# Patient Record
Sex: Female | Born: 1945 | Race: White | Hispanic: No | Marital: Married | Smoking: Never smoker
Health system: Southern US, Community
[De-identification: ages and names within clinical notes are randomized; demographics above are authoritative.]

## PROBLEM LIST (undated history)

## (undated) DIAGNOSIS — I517 Cardiomegaly: Secondary | ICD-10-CM

## (undated) DIAGNOSIS — I639 Cerebral infarction, unspecified: Secondary | ICD-10-CM

## (undated) DIAGNOSIS — I119 Hypertensive heart disease without heart failure: Secondary | ICD-10-CM

## (undated) DIAGNOSIS — I499 Cardiac arrhythmia, unspecified: Secondary | ICD-10-CM

## (undated) DIAGNOSIS — E669 Obesity, unspecified: Secondary | ICD-10-CM

## (undated) DIAGNOSIS — I2510 Atherosclerotic heart disease of native coronary artery without angina pectoris: Secondary | ICD-10-CM

## (undated) DIAGNOSIS — I4819 Other persistent atrial fibrillation: Secondary | ICD-10-CM

## (undated) HISTORY — PX: ABDOMINAL HYSTERECTOMY: SHX81

## (undated) HISTORY — PX: BIOPSY THYROID: PRO38

## (undated) HISTORY — DX: Cerebral infarction, unspecified: I63.9

## (undated) HISTORY — PX: ABDOMINAL HYSTERECTOMY: SUR658

## (undated) HISTORY — DX: Other persistent atrial fibrillation: I48.19

## (undated) HISTORY — PX: THYROIDECTOMY, PARTIAL: SHX18

---

## 2005-01-26 ENCOUNTER — Ambulatory Visit: Payer: Self-pay | Admitting: Internal Medicine

## 2005-02-14 ENCOUNTER — Ambulatory Visit: Payer: Self-pay | Admitting: Cardiology

## 2005-03-07 ENCOUNTER — Ambulatory Visit: Payer: Self-pay | Admitting: Cardiology

## 2005-10-09 ENCOUNTER — Ambulatory Visit: Payer: Self-pay | Admitting: Cardiology

## 2005-10-26 ENCOUNTER — Ambulatory Visit: Payer: Self-pay | Admitting: Cardiology

## 2006-05-29 ENCOUNTER — Ambulatory Visit: Payer: Self-pay | Admitting: Cardiology

## 2006-05-31 ENCOUNTER — Ambulatory Visit: Payer: Self-pay | Admitting: Cardiology

## 2006-08-06 DIAGNOSIS — I2510 Atherosclerotic heart disease of native coronary artery without angina pectoris: Secondary | ICD-10-CM

## 2006-08-06 HISTORY — DX: Atherosclerotic heart disease of native coronary artery without angina pectoris: I25.10

## 2006-08-29 ENCOUNTER — Encounter: Admission: RE | Admit: 2006-08-29 | Discharge: 2006-08-29 | Payer: Self-pay | Admitting: Obstetrics and Gynecology

## 2006-12-03 ENCOUNTER — Ambulatory Visit: Payer: Self-pay | Admitting: Nurse Practitioner

## 2006-12-03 ENCOUNTER — Encounter: Payer: Self-pay | Admitting: Cardiology

## 2006-12-05 ENCOUNTER — Inpatient Hospital Stay (HOSPITAL_BASED_OUTPATIENT_CLINIC_OR_DEPARTMENT_OTHER): Admission: RE | Admit: 2006-12-05 | Discharge: 2006-12-05 | Payer: Self-pay | Admitting: Cardiology

## 2006-12-05 ENCOUNTER — Ambulatory Visit: Payer: Self-pay | Admitting: Cardiology

## 2006-12-10 ENCOUNTER — Ambulatory Visit: Payer: Self-pay | Admitting: Cardiology

## 2006-12-11 ENCOUNTER — Ambulatory Visit: Payer: Self-pay | Admitting: Cardiology

## 2006-12-17 ENCOUNTER — Ambulatory Visit: Payer: Self-pay | Admitting: Cardiology

## 2007-08-14 ENCOUNTER — Encounter: Admission: RE | Admit: 2007-08-14 | Discharge: 2007-08-14 | Payer: Self-pay | Admitting: Obstetrics and Gynecology

## 2008-05-25 ENCOUNTER — Ambulatory Visit: Payer: Self-pay | Admitting: Cardiology

## 2008-06-02 ENCOUNTER — Ambulatory Visit: Payer: Self-pay | Admitting: Cardiology

## 2008-08-23 ENCOUNTER — Encounter: Admission: RE | Admit: 2008-08-23 | Discharge: 2008-08-23 | Payer: Self-pay | Admitting: Family Medicine

## 2009-02-18 ENCOUNTER — Ambulatory Visit: Payer: Self-pay | Admitting: Cardiology

## 2009-02-22 ENCOUNTER — Ambulatory Visit: Payer: Self-pay

## 2009-02-25 ENCOUNTER — Encounter: Payer: Self-pay | Admitting: Physician Assistant

## 2009-03-25 ENCOUNTER — Encounter: Payer: Self-pay | Admitting: Physician Assistant

## 2009-04-27 DIAGNOSIS — I080 Rheumatic disorders of both mitral and aortic valves: Secondary | ICD-10-CM

## 2009-04-27 DIAGNOSIS — I429 Cardiomyopathy, unspecified: Secondary | ICD-10-CM | POA: Insufficient documentation

## 2009-04-27 DIAGNOSIS — I2510 Atherosclerotic heart disease of native coronary artery without angina pectoris: Secondary | ICD-10-CM

## 2009-08-24 ENCOUNTER — Encounter: Admission: RE | Admit: 2009-08-24 | Discharge: 2009-08-24 | Payer: Self-pay | Admitting: Family Medicine

## 2009-12-01 ENCOUNTER — Encounter: Payer: Self-pay | Admitting: Cardiology

## 2010-03-07 ENCOUNTER — Ambulatory Visit: Payer: Self-pay | Admitting: Cardiology

## 2010-03-07 ENCOUNTER — Encounter: Payer: Self-pay | Admitting: Physician Assistant

## 2010-03-07 DIAGNOSIS — I10 Essential (primary) hypertension: Secondary | ICD-10-CM

## 2010-03-15 ENCOUNTER — Ambulatory Visit: Payer: Self-pay | Admitting: Cardiology

## 2010-03-15 ENCOUNTER — Encounter: Payer: Self-pay | Admitting: Physician Assistant

## 2010-03-17 ENCOUNTER — Encounter (INDEPENDENT_AMBULATORY_CARE_PROVIDER_SITE_OTHER): Payer: Self-pay | Admitting: *Deleted

## 2010-08-26 ENCOUNTER — Other Ambulatory Visit: Payer: Self-pay | Admitting: Obstetrics and Gynecology

## 2010-08-26 DIAGNOSIS — Z1239 Encounter for other screening for malignant neoplasm of breast: Secondary | ICD-10-CM

## 2010-09-05 NOTE — Assessment & Plan Note (Signed)
Summary: 1 YR FU PER JULY REMINDER-SRS   Visit Type:  Follow-up Primary [name]:  [name]   History of Present Illness: patient presents for annual followup.  Since her last visit here back in July 2010, she reports no significant change from her baseline level of exercise tolerance. She has since been started on lisinopril, per Dr. Margarita Mail recommendation, for blood pressure management, and she has noted a significant drop in her systolic BP, when occasionally checking it at local pharmacies.  Preventive Screening-Counseling & Management  Alcohol-Tobacco     Smoking Status: never  Current Medications (verified): 1)  Lasix 20 Mg Tabs (Furosemide) .... Take 1 Tablet By Mouth Once A Day 2)  Lisinopril-Hydrochlorothiazide 20-12.5 Mg Tabs (Lisinopril-Hydrochlorothiazide) .... Take 1 Tablet By Mouth Once A Day 3)  Toprol Xl 50 Mg Xr24h-Tab (Metoprolol Succinate) .... Take 1/2 Tablet By Mouth Two Times A Day 4)  Aspir-Trin 325 Mg Tbec (Aspirin) .... Take 1 Tablet By Mouth Once A Day 5)  Caltrate 600+d 600-400 Mg-Unit Tabs (Calcium Carbonate-Vitamin D) .... Take 1 Tablet By Mouth Two Times A Day 6)  Vitamin D 1000 Unit Tabs (Cholecalciferol) .... Take 1 Tablet By Mouth Once A Day 7)  Simvastatin 40 Mg Tabs (Simvastatin) .... Take 1 Tablet By Mouth Once A Day 8)  Multivitamins  Tabs (Multiple Vitamin) .... Take 1 Tablet By Mouth Once A Day 9)  Coenzyme Q10 50 Mg Caps (Coenzyme Q10) .... Take 1 Tablet By Mouth Once A Day 10)  Glucosamine 500 Mg Caps (Glucosamine Sulfate) .... Take 1 Tablet By Mouth Twice A Day  Allergies (verified): 1)  ! Codeine  Comments:  Nurse/Medical Assistant: The patient's medication list and allergies were reviewed with the patient and were updated in the Medication and Allergy Lists.  Past History:  Past Medical History: Last updated: 04/27/2009 MITRAL REGURGITATION, MODERATE (ICD-396.3) [name], NATIVE VESSEL (ICD-414.01) CARDIOMYOPATHY, SECONDARY  (ICD-425.9)    Review of Systems       No fevers, chills, hemoptysis, dysphagia, melena, hematocheezia, hematuria, rash, claudication, orthopnea, pnd, pedal edema. All other systems negative.   Vital Signs:  Patient profile:   65 year old female Height:      64 inches Weight:      224 pounds BMI:     38.59 Pulse rate:   65 / minute BP sitting:   135 / 82  (left arm) Cuff size:   large  Vitals Entered By: Carlye Grippe (March 07, 2010 11:07 AM)  Nutrition Counseling: Patient's BMI is greater than 25 and therefore counseled on weight management options.  Physical Exam  Additional Exam:  GEN:65 year old female, obese, sitting upright, no distress HEENT: NCAT,PERRLA,EOMI NECK: palpable pulses, no bruits; no JVD; no TM LUNGS: CTA bilaterally HEART: RRR (S1S2); soft grade 1-2/6 systolic ejection murmur at the base, with slight increase by Valsalva; no rubs; no gallops ABD: soft, NT; intact BS EXT: intact distal pulses; no edema SKIN: warm, dry MUSC: no obvious deformity NEURO: A/O (x3)     EKG  Procedure date:  03/07/2010  Findings:      normal sinus rhythm at 63 bpm; normal axis; borderline LVH by voltage criteria; chronic T wave inversion in inferolateral leads  Impression & Recommendations:  Problem # 1:  CARDIOMYOPATHY, SECONDARY (ICD-425.9)  we'll order a surveillance 2-D echocardiogram for reassessment of hypertrophic cardiomyopathy, and rule out of any significant valvular disease. Her last echo in October 2009 indicated normal LV function (EF 60-65%), normal wall motion, and moderate concentric LVH. There  were no significant valvular abnormalities. Schedule return visit with Dr. Andee Lineman in one year.  Orders: 2-D Echocardiogram (2D Echo)  Problem # 2:  ESSENTIAL HYPERTENSION, BENIGN (ICD-401.1)  improved, following the addition of lisinopril. Followup blood work was stable.  Problem # 3:  [name], NATIVE VESSEL (ICD-414.01)  continue secondary prevention,  including aggressive lipid management with target LDL of 70 or less, if feasible. Will decrease aspirin from full dose to 81 mg daily.  Other Orders: EKG w/ Interpretation (93000)  Patient Instructions: 1)  Your physician wants you to follow-up in: 1 year. You will receive a reminder letter in the mail one-two months in advance. If you don't receive a letter, please call our office to schedule the follow-up appointment. 2)  Decrease Aspirin to 81mg  by mouth once daily. 3)  Your physician has requested that you have an echocardiogram.  Echocardiography is a painless test that uses sound waves to create images of your heart. It provides your doctor with information about the size and shape of your heart and how well your heart's chambers and valves are working.  This procedure takes approximately one hour. There are no restrictions for this procedure. If the results of your test are normal or stable, you will receive a letter. If they are abnormal, the nurse will contact you by phone.

## 2010-09-05 NOTE — Letter (Signed)
Summary: External Correspondence/ OFFICE VISIT [facility]  External Correspondence/ OFFICE VISIT [facility]   Imported By: Dorise Hiss 12/07/2009 15:11:24  _____________________________________________________________________  External Attachment:    Type:   Image     Comment:   External Document

## 2010-09-05 NOTE — Letter (Signed)
Summary: Engineer, materials at [facility]  [address]   [place], [place] [address]   Phone: [phone]  Fax: [phone]        March 17, 2010 MRN: [id number]   ROZALYNN BUEGE [address] [place], [place]  [address]   Dear Ms. Corine Shelter,  Your test ordered by Selena Batten has been reviewed by your physician (or physician assistant) and was found to be normal or stable. Your physician (or physician assistant) felt no changes were needed at this time.  __X__ Echocardiogram  ____ Cardiac Stress Test  ____ Lab Work  ____ Peripheral vascular study of arms, legs or neck  ____ CT scan or X-ray  ____ Lung or Breathing test  ____ Other:   Thank you.   Hoover Brunette, LPN    Duane Boston, M.D., F.A.C.C. Thressa Sheller, M.D., F.A.C.C. Oneal Grout, M.D., F.A.C.C. Cheree Ditto, M.D., F.A.C.C. Daiva Nakayama, M.D., F.A.C.C. Kenney Houseman, M.D., F.A.C.C. Jeanne Ivan, PA-C

## 2010-09-26 ENCOUNTER — Encounter: Payer: Self-pay | Admitting: Cardiology

## 2010-09-26 ENCOUNTER — Ambulatory Visit
Admission: RE | Admit: 2010-09-26 | Discharge: 2010-09-26 | Disposition: A | Discharge status: Home / Self Care | Payer: Medicare Other | Source: Ambulatory Visit | Attending: Obstetrics and Gynecology | Admitting: Obstetrics and Gynecology

## 2010-09-26 DIAGNOSIS — Z1239 Encounter for other screening for malignant neoplasm of breast: Secondary | ICD-10-CM

## 2010-10-03 ENCOUNTER — Encounter: Payer: Self-pay | Admitting: Cardiology

## 2010-10-17 NOTE — Letter (Signed)
Summary: External Correspondence/  [facility]  External Correspondence/  [facility]   Imported By: Dorise Hiss 10/11/2010 15:16:43  _____________________________________________________________________  External Attachment:    Type:   Image     Comment:   External Document

## 2010-12-19 NOTE — Assessment & Plan Note (Signed)
[facility]                          CHRONIC HEART FAILURE NOTE   [name], [name]                    MRN:          [id number]  DATE:12/03/2006                            DOB:          24-Nov-1945    [name] presents today for a followup visit.  She is followed by Dr.  Andee Lineman and Dr. [name].  She is a very pleasant 65 year old Caucasian  female seen in the past by Dr. Andee Lineman secondary to mitral regurgitation.  Apparently, was diagnosed after a new murmur was found by her GYN  physician.  Patient underwent a TEE that showed moderate MR with  possible partial cordal tear.  Patient has been followed by Dr. Andee Lineman,  and has done well.  She also underwent a stress Myoview with Bruce  protocol in March 2007 that showed a normal EF, and no ischemia at that  time.  Most recent echocardiogram done in October 2007 showed an EF of  55% to 60% MR.  [name], today, denies any episodes of chest  discomfort.  She does complain of increased shortness of breath.  She  contributes most of her shortness of breath and fatigue to her weight.  She has never had any frank chest discomfort, heaviness, or tightness.  She is an active 65 year old female.  She watches her grand kids.  She  tries to do some light yard work.  She is limited, however, by her left  knee discomfort.  She states this prevents her from being as active as  she would like in trying to lose weight.  She is concerned, however,  about the ongoing dyspnea that seems to be worse over the last few  months.  She denies any orthopnea, PND, presyncope, or syncopal  episodes.  Her primary complaint is of her dyspnea and knee pain  secondary to degenerative joint disease.   PAST MEDICAL HISTORY:  Also includes:  1. Morbid obesity.  2. Hypertension.  3. Degenerative joint disease.  4. Moderate mitral regurgitation.  5. Moderate left ventricular hypertrophy.  6. Hyperlipidemia.   REVIEW OF  SYSTEMS:  As stated above.   CURRENT MEDICATIONS:  Include:  1. Simvastatin 40 mg daily.  2. Lisinopril/hydrochlorothiazide 20/25 mg daily.  3. CoQ10 50 mg daily.  4. Calcium and vitamin D.  5. Multivitamin.  6. Glucosamine 1000 mg daily.  7. Aspirin 325 mg daily.   ALLERGIES:  CODEINE.   PHYSICAL EXAMINATION:  Weight 228 pounds.  Blood pressure initially  154/86 manual, 142/90 with a heart rate of 91.  [name] is in no  acute distress.  NECK:  Supple without carotid bruits.  No JVD noted.  LUNGS:  Clear to auscultation bilaterally.  CARDIOVASCULAR EXAM:  Revealed an S1 and S2.  Regular rate and rhythm.  Patient has a 2/6 systolic ejection murmur.  ABDOMEN:  Soft and non-tender.  Positive bowel sounds.  LOWER EXTREMITIES:  Without clubbing, cyanosis, or edema.  NEUROLOGIC:  Patient alert and oriented x3.  Cranial nerves 2 through 12  grossly intact.  A 12-lead EKG obtained today shows change from previous EKG obtained  during  stress Myoview in March 2007.  Patient has T wave inversions in  inferior and lateral leads.  Heart rate of 91.   IMPRESSION:  1. Ongoing dyspnea, which is increased in intensity in the setting of      abnormal EKG with change present since March 2007, at which time      patient had a negative stress Myoview.  2. Hypertension.  3. Hyperlipidemia.  4. Obesity.   PLAN:  EKG changes have been discussed with patient.  She had negative  stress Myoview approximately 1 year ago.  However, with her obesity and  breast attenuation, I would recommend proceeding with cardiac  catheterization for clear etiology of her EKG changes.  [name] has  been educated on the risks and benefits of cardiac catheterization, and  agrees to proceed.  This will be arranged through the [facility] this week.  I am also going to start her on Toprol XL 25 mg daily in  addition to lisinopril and hydrochlorothiazide.  I have asked her to  continue the aspirin.  I  have also given her a prescription for  nitroglycerin p.r.n. to have on hand.  We will obtain baseline lab work,  and proceed with cardiac catheterization.  Patient knows if her symptoms  change or worsen, she is to seek immediate medical assistance prior to  her scheduled cardiac catheterization on Thursday.      Dorian Pod, ACNP       Learta Codding, MD,FACC    MB/MedQ  DD: 12/03/2006  DT: 12/03/2006  Job #: [id number]

## 2010-12-19 NOTE — Assessment & Plan Note (Signed)
[facility]                          [facility] NOTE   NAME:[name], [name]                    MRN:          [id number]  DATE:05/25/2008                            DOB:          June 16, 1946    HISTORY OF PRESENT ILLNESS:  The patient is a 65 year old female with a  history of hypertrophic cardiomyopathy.  She also has known mitral  regurgitation.  The patient went for a second opinion to [facility].  It was felt that she could be treated medically and we do  not have a formal evaluation, but metoprolol was increased.  She states  that she is doing well.  She has had no deterioration in her symptoms.  She is short of breath on exertion probably due to deconditioning and  rate.  It was felt that the mitral regurgitation was stable and did not  need surgical intervention.  Interestingly, the patient reports that  after colonoscopy post anesthesia, she had a saturation of 82%.  She  gets many features of possible obstructive sleep apnea.  Next, she  denies any chest pain, palpitations, or syncope.   MEDICATIONS:  1. Simvastatin 40 mg p.o. daily.  2. Lisinopril/hydrochlorothiazide 20/25 p.o. daily.  3. Coenzyme Q10.  4. Calcium with vitamin D.  5. Multivitamin.  6. Glucosamine 1000 mg a day.  7. Aspirin 325.  8. Metoprolol 25 mg twice a day.  9. Hydrochlorothiazide 25 mg p.o. daily.   PHYSICAL EXAMINATION:  VITAL SIGNS:  Blood pressure 157/90, heart rate  69, weight 230 pounds.  NECK:  Normal carotid upstrokes.  No carotid bruits.  LUNGS:  Clear breath sounds bilaterally.  HEART:  Regular rate and rhythm.  Normal S1 and S2 and a 2/6 crescendo-  decrescendo murmur.  ABDOMEN:  Soft, nontender.  No rebound or guarding.  Good bowel sounds.  EXTREMITIES:  No cyanosis, clubbing, or edema.   PROBLEMS:  1. Hypertrophic cardiomyopathy (nonobstructive).  2. Mitral regurgitation (moderate).  3. New York Heart Association class IIB.  4.  Nonobstructive coronary artery disease.  5. Rule out obstructive sleep apnea.   PLAN:  1. The patient is stable from a cardiac standpoint.  I reviewed her      EKG which continues to demonstrate the left ventricular hypertrophy      with secondary repolarization changes.  I also changed her      hydrochlorothiazide to Lasix as on her catheterization,  her wedge      pressure was 24 mmHg.  2. I do think that the patient has significant obstructive sleep apnea      which can be contributing to her hypertension, hypertrophic      cardiomyopathy with no realities.  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  3. We will repeat the echo to reassess mitral regurgitation.     Learta Codding, MD,FACC  Electronically Signed    GED/MedQ  DD: 05/25/2008  DT: 05/26/2008  Job #: [id number]

## 2010-12-19 NOTE — Cardiovascular Report (Signed)
NAME:  [name], [name] NO.:  [id number]   MEDICAL RECORD NO.:  [id number]          PATIENT TYPE:  OIB   LOCATION:  1965                         FACILITY:  [facility]   PHYSICIAN:  Salvadore Farber, MD  DATE OF BIRTH:  05-22-1946   DATE OF PROCEDURE:  12/05/2006  DATE OF DISCHARGE:                            CARDIAC CATHETERIZATION   CARDIAC CATHETERIZATION   PROCEDURE:  Left and right heart catheterization, left ventriculography,  coronary angiography.   INDICATIONS:  [name] is a 65 year old lady with mitral  regurgitation which has been followed by Dr. Andee Lineman.  Last year, she had  a transesophageal echocardiogram demonstrating at least moderate mitral  regurgitation with possible chordal rupture.  She presented for followup  yesterday with persistent dyspnea.  Electrocardiogram demonstrated new T-  wave inversions across the precordium.  She was referred for right and  left heart catheterization.   PROCEDURAL TECHNIQUE:  Informed consent was obtained.  Underwent 1%  lidocaine local anesthesia, a 4-French sheath was placed in the right  common femoral artery and a 7-French sheath in the right common femoral  vein using modified Seldinger technique.  Heart catheterization was  performed using a balloon-tipped catheter.  Cardiac output was measured  using the thermodilution technique.  Left heart catheterization and  ventriculography were performed using a pigtail catheter.  Coronary  angiography was performed using JL-4 and 3-D RCA catheters.  The patient  tolerated the procedure well and was transferred to the holding room in  stable condition.  Sheaths will be removed there.   COMPLICATIONS:  None.   FINDINGS:  HEMODYNAMICS:  RA 14/13/11, RV 50/14/17, PA 44/30/36, PCW 28/23/22, aorta 145/80/109,  LV 142/13/28.  Cardiac output/index (thermodilution) 4.5/2.2.  There is  no aortic stenosis or mitral stenosis.   VENTRICULOGRAPHY:  Ejection fraction is  more than 65% without regional wall motion  abnormality.  There is at least moderate mitral regurgitation.  Grading  of the mitral regurgitation is hindered by ectopy during  ventriculography.   CORONARY ANGIOGRAPHY:  Left main:  Angiographically normal.  LAD:  Moderate-sized vessel giving rise to moderate sized diagonals.  It  is angiographically normal.  Circumflex:  Moderate-sized vessel giving rise to one large branching  marginal.  It is angiographically normal.  RCA:  Moderate-sized dominant vessel.  There is a 30% stenosis in its  midsection.   IMPRESSION/PLAN:  1. Moderate pulmonary hypertension in the setting of elevated      pulmonary capillary wedge pressure.  2. At least moderate mitral regurgitation.  3. Normal left ventricular size and systolic function.  4. Minimal coronary disease.   The patient will follow up with Dr. Andee Lineman.  Her moderate pulmonary  hypertension does raise some concern in the setting of her at least  moderate mitral regurgitation.  Question has previously been raised of a  chordal rupture.  We will have her follow with Dr. Andee Lineman for  consideration of repeat transesophageal echocardiogram.  Her chronic  dyspnea makes evaluation of her symptomatology difficult.      Salvadore Farber, MD  Electronically Signed     WED/MEDQ  D:  12/05/2006  T:  12/05/2006  Job:  [id number]

## 2010-12-19 NOTE — Assessment & Plan Note (Signed)
[facility]                          [facility] NOTE   [name], [name]                    MRN:          [id number]  DATE:12/17/2006                            DOB:          08-26-1945    HISTORY OF PRESENT ILLNESS:  The patient is a 65 year old female with a  history of possible chord tear and moderate mitral regurgitation. The  patient underwent a recent catheterization. She was found the have no  outflow gradient. Pulmonary capillary wedge pressure was 24 mmHg. She  was found to have moderately severe mitral regurgitation by  ventriculography.   The patient presents today for a groin check. Her groin does demonstrate  a hematoma and a knot at the incision site; however, this knot is not  pulsatile, there is no bruit noted. The groin appears to be stable and  there are no signs of infection.   The patient was reassured about this finding. She was also told that she  would have an appointment with Dr. Regino Schultze at [facility] to review the echocardiographic studies and catheterization. The  question is whether the patient could have some degree of outflow  gradient rather than pure mitral regurgitation as a cause for her  dyspnea. A message will be sent to Dr. Regino Schultze. The patient will be called  and will take all her reports and CDs of the imaging studies to Dr.  Regino Schultze.     Learta Codding, MD,FACC     GED/MedQ  DD: 12/17/2006  DT: 12/17/2006  Job #: [id number]

## 2010-12-19 NOTE — Assessment & Plan Note (Signed)
[facility]                          [facility] NOTE   [name], [name]                    MRN:          [id number]  DATE:02/18/2009                            DOB:          [birth year]    REFERRING PHYSICIAN:  Donzetta Sprung   HISTORY OF PRESENT ILLNESS:  The patient is a 65 year old female with a  history of hypertrophic cardiomyopathy.  Although the patient has been  evaluated by [facility] and recommendation was to treat her  medically.  At that time, metoprolol was increased.  Unfortunately  particularly during the last office visit now again also the patient has  significant hypertension which is not controlled.  She assures me that  at home her blood pressure is normal, but then again she never really  takes it.  It was normal during the last office visit with Dr. [name].  However, I told the patient I am very concerned and I do think that she  has significant hypertension and more like has a hypertensive-  hypertrophic cardiomyopathy.  The patient, however, feels good.  She  denies any chest pain, shortness of breath, orthopnea, or PND.  She is  very active.  She has no specific cardiovascular complaints.   MEDICATIONS:  1. Simvastatin 40 mg p.o. daily.  2. Coenzyme Q10 50 mg p.o. daily.  3. Calcium and vitamin D 600 mg p.o. b.i.d.  4. Multivitamin.  5. Glucosamine.  6. Aspirin 325 daily.  7. Metoprolol 50 mg half a tablet p.o. b.i.d.  8. Lasix 20 mg p.o. daily.  9. Vitamin D 1000 units daily.   PHYSICAL EXAMINATION:  VITAL SIGNS:  Blood pressure 169/83, heart rate  64, and weight is 228 pounds.  GENERAL:  Overweight white female in no apparent distress.  HEENT:  Pupils are anisocoric.  Conjunctivae clear.  NECK:  Supple.  Normal carotid upstroke.  No carotid bruits.  LUNGS:  Clear breath sounds bilaterally.  HEART:  Regular rate and rhythm.  Normal S1 and S2.  No pathological  murmurs.  ABDOMEN:  Soft and  nontender.  No rebound or guarding.  Good bowel  sounds.  EXTREMITIES:  No cyanosis, clubbing, or edema.  NEUROLOGIC:  The patient is alert, oriented, and grossly nonfocal.   PROBLEM LIST:  1. Nonobstructive hypertrophic cardiomyopathy, cannot rule out      hypertensive-hypertrophic cardiomyopathy.  2. Moderate mitral regurgitation.  No significant pathological murmur      currently.  3. Nonobstructive coronary artery disease.  4. New York Heart Association class I-II.  5. Rule out obstructive sleep apnea.   PLAN:  1. From cardiac standpoint, the patient is doing well.  She has no      symptoms.  She will need an echocardiogram in 1 year.  2. I do suspect the patient has significant hypertension and she is      currently not taking lisinopril or hydrochlorothiazide anymore.  I      have asked her to come in next week with her blood pressure cuff      and to obtain readings for 10 days, twice a day.  If she is indeed      hypertensive, we will start her on lisinopril 20 mg p.o. daily and      check a BMET in 1 week.     Learta Codding, MD,FACC  Electronically Signed    GED/MedQ  DD: 02/18/2009  DT: 02/19/2009  Job #: [id number]   cc:   Donzetta Sprung

## 2010-12-19 NOTE — Assessment & Plan Note (Signed)
[facility]                          [facility]   SHERIDEN, ARCHIBEQUE                    MRN:          [id number]  DATE:12/10/2006                            DOB:          July 03, 1946    REFERRING PHYSICIAN:  Donzetta Sprung   HISTORY OF PRESENT ILLNESS:  The patient is a 65 year old female with a  history of  possible partial chordal tear and moderate mitral  regurgitation.  The patient has a normal ejection fraction previously  documented at greater than 65%.  The patient was seen by the PA in the  office on December 03, 2006, and complained of possibly some progressive  dyspnea.  She was scheduled for catheterization and was found to have  minimal coronary artery disease.  She was found to have elevated  pulmonary capillary wedge pressure of approximately 24 millimeters of  mercury with a normal cardiac index and mild to moderate pulmonary  hypertension.  Ejection fraction was greater than 65% and by  ventriculogram, there was at least moderate mitral regurgitation.   The patient states that since her last visit she was put on metoprolol  and has experienced a significant increase in the level of fatigue.  Her  shortness of breath is stable.  She is NYH class 2B.  She has no chest  pain.  She also reports no complication related to her groin stick.   MEDICATIONS:  Simvastatin 20 mg a day.  Lisinopril/Hydrochlorothiazide 20 mg/25 mg p.o. daily.  Co-Enzyme Q10 50 mg a day.  Calcium.  Vitamin D.  Multivitamin.  Glucosamine.  Aspirin.  Metoprolol ER 25 mg a day.   PHYSICAL EXAMINATION:  VITAL SIGNS:  Blood pressure 121/67.  Heart rate  96 beats per minute.  Weight 229.  GENERAL:  Overweight white female but in no apparent distress.  HEENT:  Pupils anisocoric.  Conjunctivae clear.  NECK:  Supple.  Normal carotid upstrokes.  No bruits.  LUNGS:  Clear breath sounds bilaterally.  HEART:  Regular rate and rhythm with normal S1 and  S2.  There is a 3-4/6  holosystolic murmur heard last at the apex.  ABDOMEN:  Soft.  EXTREMITY EXAM:  No cyanosis, clubbing, or edema.   PROBLEM LIST:  1. Mitral regurgitation, moderate by echo and ventriculography.  2. New York Heart Association class 2B.  3. Possible partial chordal tear.  4. Nonobstructive coronary artery disease.  5. Left ventricular hypertrophy.   PLAN:  1. The patient will be referred for repeat echocardiographic study to      reassess her mitral regurgitation.  2. The patient going forward will need an echo every six months with      close attention to ventricular volumes and ejection fraction.  3. If there are any questions regarding severity of mitral      regurgitation, particularly in light of some increasing shortness      of breath, the patient may require a repeat transesophageal      echocardiogram.  4. We have also decided to start diuretic treatment with Lasix based      on her severe mitral regurgitation  after reviewing the      echocardiogram.     Learta Codding, MD,FACC     GED/MedQ  DD: 12/10/2006  DT: 12/10/2006  Job #: [id number]   cc:   Donzetta Sprung

## 2010-12-19 NOTE — Assessment & Plan Note (Signed)
[facility]                          [facility] NOTE   [name], [name]                    MRN:          [id number]  DATE:12/03/2006                            DOB:          October 22, 1945    [name] is here today for a followup appointment status post recent  stress Myoview. She underwent a Cardiolite stress by Bruce protocol on  March   Dictation ended at this point.      Dorian Pod, ACNP       Learta Codding, MD,FACC    MB/MedQ  DD: 12/03/2006  DT: 12/03/2006  Job #: [id number]

## 2010-12-22 NOTE — Assessment & Plan Note (Signed)
[facility]                            [facility] NOTE   NAME:WATKINSClemie, General                    MRN:          [id number]  DATE:05/29/2006                            DOB:          [birth year]    HISTORY OF PRESENT ILLNESS:  The patient is a 65 year old female with a  history of known mitral regurgitation.  The patient is status post  transesophageal echocardiographic test study which showed moderate mitral  regurgitation with possible partial chordal tear.  The patient has been  doing well.  She reports no chest pain, shortness of breath, orthopnea, or  PND.  She remains very active.  She has no palpitations or syncope.  She  does report some knee pain secondary to degenerative joint disease.   MEDICATIONS:  1. Simvastatin 40 mg a day.  2. Lisinopril with hydrochlorothiazide 20/25 mg a day.  3. Coenzyme Q10 50 mg a day.  4. Calcium with vitamin D 600 mg p.o. b.i.d.  5. Multivitamin daily.  6. Glucosamine 100 mg p.o. daily.  7. Aspirin 325 daily.   PHYSICAL EXAMINATION:  VITAL SIGNS:  Blood pressure 138/78, heart rate 84  beats per minute, weight is 227 pounds.  NECK:  Normal carotid upstroke, no carotid bruits.  LUNGS:  Clear breath sounds bilaterally.  HEART:  Regular rate and rhythm with normal S1, S2, and a 2-3/6 holosystolic  murmur at the left lower sternal border and towards the apex.  ABDOMEN:  Soft.  EXTREMITIES:  No cyanosis, clubbing or edema.   PROBLEM LIST:  1. Moderate mitral regurgitation.  2. Normal left ventricular size and contraction, ejection fraction 67%.  3. NYHA class I.  4. Moderate left ventricular hypertrophy.   1. The patient will have a followup echocardiogram done tomorrow.  2. The patient had a previous Cardiolite study which showed no ischemia.  3. The patient can continue on her current medical regimen.  There is no      evidence of volume overload.   Will see her back in 1 year.       Learta Codding, MD,FACC     GED/MedQ  DD:  05/29/2006  DT:  05/30/2006  Job #:  [id number]

## 2011-08-07 DIAGNOSIS — I517 Cardiomegaly: Secondary | ICD-10-CM

## 2011-08-07 HISTORY — DX: Cardiomegaly: I51.7

## 2011-08-27 ENCOUNTER — Other Ambulatory Visit: Payer: Self-pay | Admitting: Family Medicine

## 2011-08-27 DIAGNOSIS — Z1231 Encounter for screening mammogram for malignant neoplasm of breast: Secondary | ICD-10-CM

## 2011-09-25 DIAGNOSIS — M25519 Pain in unspecified shoulder: Secondary | ICD-10-CM | POA: Diagnosis not present

## 2011-11-13 ENCOUNTER — Ambulatory Visit: Payer: Medicare Other

## 2011-11-13 ENCOUNTER — Ambulatory Visit
Admission: RE | Admit: 2011-11-13 | Discharge: 2011-11-13 | Disposition: A | Discharge status: Home / Self Care | Payer: Medicare Other | Source: Ambulatory Visit | Attending: Family Medicine | Admitting: Family Medicine

## 2011-11-13 DIAGNOSIS — Z1231 Encounter for screening mammogram for malignant neoplasm of breast: Secondary | ICD-10-CM | POA: Diagnosis not present

## 2011-11-15 DIAGNOSIS — E785 Hyperlipidemia, unspecified: Secondary | ICD-10-CM | POA: Diagnosis not present

## 2011-11-15 DIAGNOSIS — Z90710 Acquired absence of both cervix and uterus: Secondary | ICD-10-CM | POA: Diagnosis not present

## 2011-11-15 DIAGNOSIS — N893 Dysplasia of vagina, unspecified: Secondary | ICD-10-CM | POA: Diagnosis not present

## 2011-11-15 DIAGNOSIS — Z01419 Encounter for gynecological examination (general) (routine) without abnormal findings: Secondary | ICD-10-CM | POA: Diagnosis not present

## 2011-11-15 DIAGNOSIS — I10 Essential (primary) hypertension: Secondary | ICD-10-CM | POA: Diagnosis not present

## 2011-11-15 DIAGNOSIS — Z1272 Encounter for screening for malignant neoplasm of vagina: Secondary | ICD-10-CM | POA: Diagnosis not present

## 2011-11-16 DIAGNOSIS — D485 Neoplasm of uncertain behavior of skin: Secondary | ICD-10-CM | POA: Diagnosis not present

## 2011-11-21 DIAGNOSIS — L98499 Non-pressure chronic ulcer of skin of other sites with unspecified severity: Secondary | ICD-10-CM | POA: Diagnosis not present

## 2011-12-14 DIAGNOSIS — E782 Mixed hyperlipidemia: Secondary | ICD-10-CM | POA: Diagnosis not present

## 2011-12-14 DIAGNOSIS — I10 Essential (primary) hypertension: Secondary | ICD-10-CM | POA: Diagnosis not present

## 2011-12-21 DIAGNOSIS — I10 Essential (primary) hypertension: Secondary | ICD-10-CM | POA: Diagnosis not present

## 2011-12-21 DIAGNOSIS — R609 Edema, unspecified: Secondary | ICD-10-CM | POA: Diagnosis not present

## 2011-12-21 DIAGNOSIS — E782 Mixed hyperlipidemia: Secondary | ICD-10-CM | POA: Diagnosis not present

## 2011-12-21 DIAGNOSIS — E119 Type 2 diabetes mellitus without complications: Secondary | ICD-10-CM | POA: Diagnosis not present

## 2011-12-21 DIAGNOSIS — L408 Other psoriasis: Secondary | ICD-10-CM | POA: Diagnosis not present

## 2011-12-21 DIAGNOSIS — M1990 Unspecified osteoarthritis, unspecified site: Secondary | ICD-10-CM | POA: Diagnosis not present

## 2011-12-21 DIAGNOSIS — R011 Cardiac murmur, unspecified: Secondary | ICD-10-CM | POA: Diagnosis not present

## 2012-02-22 ENCOUNTER — Encounter: Payer: Self-pay | Admitting: Cardiology

## 2012-03-25 ENCOUNTER — Encounter: Payer: Self-pay | Admitting: Cardiology

## 2012-03-25 ENCOUNTER — Ambulatory Visit (INDEPENDENT_AMBULATORY_CARE_PROVIDER_SITE_OTHER): Payer: Medicare Other | Admitting: Cardiology

## 2012-03-25 VITALS — BP 129/77 | HR 64 | Ht 64.0 in | Wt 227.0 lb

## 2012-03-25 DIAGNOSIS — I429 Cardiomyopathy, unspecified: Secondary | ICD-10-CM | POA: Diagnosis not present

## 2012-03-25 DIAGNOSIS — I2510 Atherosclerotic heart disease of native coronary artery without angina pectoris: Secondary | ICD-10-CM

## 2012-03-25 DIAGNOSIS — I422 Other hypertrophic cardiomyopathy: Secondary | ICD-10-CM

## 2012-03-25 DIAGNOSIS — I080 Rheumatic disorders of both mitral and aortic valves: Secondary | ICD-10-CM

## 2012-03-25 DIAGNOSIS — I10 Essential (primary) hypertension: Secondary | ICD-10-CM

## 2012-03-25 NOTE — Patient Instructions (Signed)
   Echo  Office will contact with results Your physician wants you to follow up in:  1 year.  You will receive a reminder letter in the mail one-two months in advance.  If you don't receive a letter, please call our office to schedule the follow up appointment  

## 2012-03-27 ENCOUNTER — Other Ambulatory Visit: Payer: Self-pay

## 2012-03-27 ENCOUNTER — Other Ambulatory Visit (INDEPENDENT_AMBULATORY_CARE_PROVIDER_SITE_OTHER): Payer: Medicare Other

## 2012-03-27 DIAGNOSIS — I422 Other hypertrophic cardiomyopathy: Secondary | ICD-10-CM

## 2012-03-27 DIAGNOSIS — I2510 Atherosclerotic heart disease of native coronary artery without angina pectoris: Secondary | ICD-10-CM | POA: Diagnosis not present

## 2012-03-31 ENCOUNTER — Encounter: Payer: Self-pay | Admitting: *Deleted

## 2012-04-11 DIAGNOSIS — E782 Mixed hyperlipidemia: Secondary | ICD-10-CM | POA: Diagnosis not present

## 2012-04-11 DIAGNOSIS — R609 Edema, unspecified: Secondary | ICD-10-CM | POA: Diagnosis not present

## 2012-04-11 DIAGNOSIS — I10 Essential (primary) hypertension: Secondary | ICD-10-CM | POA: Diagnosis not present

## 2012-04-13 NOTE — Assessment & Plan Note (Signed)
Hypertrophic cardiomyopathy

## 2012-04-13 NOTE — Progress Notes (Signed)
Claudia Bottoms, MD, [facility] ABIM Board Certified in Adult Cardiovascular Medicine,Internal Medicine and Critical Care Medicine    CC:    followup patient with hypertrophic cardiomyopathy                                                                              HPI:       The patient is a very pleasant 66 year old female with a history of hypertrophic cardiomyopathy and mitral regurgitation.  Clinically she has been doing well.  She reports no syncope or shortness of breath or chest pain he does just on exertion.  Blood pressure has been well-controlled.  He has a history of coronary artery disease but has had no recurrent chest pain.  Her ejection fraction normal at 6065%. Cardiovascular standpoint is otherwise completely asymptomatic.   PMH: reviewed and listed in Problem List in Electronic Records (and see below) Past Medical History  Diagnosis Date  . Coronary atherosclerosis of native coronary artery   . Secondary cardiomyopathy, unspecified   . Essential hypertension, benign   . Mitral valve insufficiency and aortic valve insufficiency    No past surgical history on file.  Allergies/SH/FHX : available in Electronic Records for review  Allergies  Allergen Reactions  . Codeine     REACTION: hallucinate   History   Social History  . Marital Status: Married    Spouse Name: N/A    Number of Children: N/A  . Years of Education: N/A   Occupational History  . Retired    Social History Main Topics  . Smoking status: Never Smoker   . Smokeless tobacco: Never Used  . Alcohol Use: No  . Drug Use: No  . Sexually Active: Not on file   Other Topics Concern  . Not on file   Social History Narrative  . No narrative on file   No family history on file.  Medications: Current Outpatient Prescriptions  Medication Sig Dispense Refill  . aspirin 81 MG tablet Take 81 mg by mouth daily.      . cholecalciferol (VITAMIN D) 1000 UNITS tablet Take 2,000 Units by mouth daily.         . Coenzyme Q10 50 MG CAPS Take 1 capsule by mouth daily.      . Flaxseed, Linseed, (FLAXSEED OIL) 1000 MG CAPS Take 1 capsule by mouth 2 (two) times daily.      . Glucosamine 500 MG CAPS Take 1 capsule by mouth 2 (two) times daily.      Marland Kitchen lisinopril-hydrochlorothiazide (PRINZIDE,ZESTORETIC) 20-12.5 MG per tablet Take 1 tablet by mouth daily.      . metoprolol (LOPRESSOR) 50 MG tablet Take 0.5 tablets by mouth Twice daily.      . simvastatin (ZOCOR) 40 MG tablet Take 40 mg by mouth every evening.        ROS: No nausea or vomiting. No fever or chills.No melena or hematochezia.No bleeding.No claudication  Physical Exam: BP 129/77  Pulse 64  Ht 5\' 4"  (1.626 m)  Wt 227 lb (102.967 kg)  BMI 38.96 kg/m2 General:well-nourished white female in no distress Neck:normal upstroke and no carotid bruits Lungs:include rest on bilaterally no wheezing Cardiac:regular rate and rhythm with normal  S1 and S2 no murmurs or gallops Vascular:no edema.  Normal distal pulses Skin:warm and dry Physcologic:normal affect  12lead ZOX:WRUEAV sinus rhythm, left ventricular hypertrophy with secondary repolarization changes Limited bedside ECHO:N/A No images are attached to the encounter.   I reviewed and summarized the old records. I reviewed ECG and prior blood work.  Assessment and Plan  CARDIOMYOPATHY, SECONDARY Hypertrophic cardiomyopathy  [name], NATIVE VESSEL No recurrent chest pain.  Continue secondary prevention  ESSENTIAL HYPERTENSION, BENIGN Blood pressure well controlled.  MITRAL REGURGITATION, MODERATE followup echocardiogram to assess for any possible outflow tract obstruction and assessment of degree of mitral regurgitation    Patient Active Problem List  Diagnosis  . MITRAL REGURGITATION, MODERATE  . ESSENTIAL HYPERTENSION, BENIGN  . [name], NATIVE VESSEL  . CARDIOMYOPATHY, SECONDARY

## 2012-04-13 NOTE — Assessment & Plan Note (Signed)
Blood pressure well controlled

## 2012-04-13 NOTE — Assessment & Plan Note (Signed)
followup echocardiogram to assess for any possible outflow tract obstruction and assessment of degree of mitral regurgitation

## 2012-04-13 NOTE — Assessment & Plan Note (Signed)
No recurrent chest pain.  Continue secondary prevention

## 2012-04-16 DIAGNOSIS — E119 Type 2 diabetes mellitus without complications: Secondary | ICD-10-CM | POA: Diagnosis not present

## 2012-04-16 DIAGNOSIS — R011 Cardiac murmur, unspecified: Secondary | ICD-10-CM | POA: Diagnosis not present

## 2012-04-16 DIAGNOSIS — M1990 Unspecified osteoarthritis, unspecified site: Secondary | ICD-10-CM | POA: Diagnosis not present

## 2012-04-16 DIAGNOSIS — I10 Essential (primary) hypertension: Secondary | ICD-10-CM | POA: Diagnosis not present

## 2012-04-16 DIAGNOSIS — R609 Edema, unspecified: Secondary | ICD-10-CM | POA: Diagnosis not present

## 2012-04-16 DIAGNOSIS — L408 Other psoriasis: Secondary | ICD-10-CM | POA: Diagnosis not present

## 2012-04-16 DIAGNOSIS — E782 Mixed hyperlipidemia: Secondary | ICD-10-CM | POA: Diagnosis not present

## 2012-05-13 DIAGNOSIS — Z23 Encounter for immunization: Secondary | ICD-10-CM | POA: Diagnosis not present

## 2012-08-18 ENCOUNTER — Other Ambulatory Visit: Payer: Self-pay | Admitting: Family Medicine

## 2012-08-18 DIAGNOSIS — Z1231 Encounter for screening mammogram for malignant neoplasm of breast: Secondary | ICD-10-CM

## 2012-08-28 DIAGNOSIS — I10 Essential (primary) hypertension: Secondary | ICD-10-CM | POA: Diagnosis not present

## 2012-08-28 DIAGNOSIS — E782 Mixed hyperlipidemia: Secondary | ICD-10-CM | POA: Diagnosis not present

## 2012-09-04 DIAGNOSIS — E7800 Pure hypercholesterolemia, unspecified: Secondary | ICD-10-CM | POA: Diagnosis not present

## 2012-11-17 DIAGNOSIS — L988 Other specified disorders of the skin and subcutaneous tissue: Secondary | ICD-10-CM | POA: Diagnosis not present

## 2012-11-17 DIAGNOSIS — L570 Actinic keratosis: Secondary | ICD-10-CM | POA: Diagnosis not present

## 2012-11-17 DIAGNOSIS — D485 Neoplasm of uncertain behavior of skin: Secondary | ICD-10-CM | POA: Diagnosis not present

## 2012-11-17 DIAGNOSIS — L259 Unspecified contact dermatitis, unspecified cause: Secondary | ICD-10-CM | POA: Diagnosis not present

## 2012-11-18 ENCOUNTER — Ambulatory Visit: Payer: Medicare Other

## 2012-12-08 DIAGNOSIS — E785 Hyperlipidemia, unspecified: Secondary | ICD-10-CM | POA: Diagnosis not present

## 2012-12-08 DIAGNOSIS — N893 Dysplasia of vagina, unspecified: Secondary | ICD-10-CM | POA: Diagnosis not present

## 2012-12-08 DIAGNOSIS — I10 Essential (primary) hypertension: Secondary | ICD-10-CM | POA: Diagnosis not present

## 2012-12-08 DIAGNOSIS — Z01419 Encounter for gynecological examination (general) (routine) without abnormal findings: Secondary | ICD-10-CM | POA: Diagnosis not present

## 2012-12-08 DIAGNOSIS — Z1272 Encounter for screening for malignant neoplasm of vagina: Secondary | ICD-10-CM | POA: Diagnosis not present

## 2012-12-09 ENCOUNTER — Ambulatory Visit
Admission: RE | Admit: 2012-12-09 | Discharge: 2012-12-09 | Disposition: A | Discharge status: Home / Self Care | Payer: Medicare Other | Source: Ambulatory Visit | Attending: Family Medicine | Admitting: Family Medicine

## 2012-12-09 DIAGNOSIS — Z1231 Encounter for screening mammogram for malignant neoplasm of breast: Secondary | ICD-10-CM | POA: Diagnosis not present

## 2012-12-16 DIAGNOSIS — R05 Cough: Secondary | ICD-10-CM | POA: Diagnosis not present

## 2012-12-16 DIAGNOSIS — J40 Bronchitis, not specified as acute or chronic: Secondary | ICD-10-CM | POA: Diagnosis not present

## 2013-01-12 DIAGNOSIS — E782 Mixed hyperlipidemia: Secondary | ICD-10-CM | POA: Diagnosis not present

## 2013-01-12 DIAGNOSIS — R011 Cardiac murmur, unspecified: Secondary | ICD-10-CM | POA: Diagnosis not present

## 2013-01-12 DIAGNOSIS — E119 Type 2 diabetes mellitus without complications: Secondary | ICD-10-CM | POA: Diagnosis not present

## 2013-01-12 DIAGNOSIS — M1990 Unspecified osteoarthritis, unspecified site: Secondary | ICD-10-CM | POA: Diagnosis not present

## 2013-01-12 DIAGNOSIS — R609 Edema, unspecified: Secondary | ICD-10-CM | POA: Diagnosis not present

## 2013-01-12 DIAGNOSIS — L408 Other psoriasis: Secondary | ICD-10-CM | POA: Diagnosis not present

## 2013-01-12 DIAGNOSIS — I10 Essential (primary) hypertension: Secondary | ICD-10-CM | POA: Diagnosis not present

## 2013-01-14 DIAGNOSIS — I10 Essential (primary) hypertension: Secondary | ICD-10-CM | POA: Diagnosis not present

## 2013-01-14 DIAGNOSIS — L408 Other psoriasis: Secondary | ICD-10-CM | POA: Diagnosis not present

## 2013-01-14 DIAGNOSIS — R011 Cardiac murmur, unspecified: Secondary | ICD-10-CM | POA: Diagnosis not present

## 2013-01-14 DIAGNOSIS — E119 Type 2 diabetes mellitus without complications: Secondary | ICD-10-CM | POA: Diagnosis not present

## 2013-01-14 DIAGNOSIS — M1990 Unspecified osteoarthritis, unspecified site: Secondary | ICD-10-CM | POA: Diagnosis not present

## 2013-01-14 DIAGNOSIS — R609 Edema, unspecified: Secondary | ICD-10-CM | POA: Diagnosis not present

## 2013-01-14 DIAGNOSIS — E782 Mixed hyperlipidemia: Secondary | ICD-10-CM | POA: Diagnosis not present

## 2013-03-23 DIAGNOSIS — M899 Disorder of bone, unspecified: Secondary | ICD-10-CM | POA: Diagnosis not present

## 2013-03-23 DIAGNOSIS — E0789 Other specified disorders of thyroid: Secondary | ICD-10-CM | POA: Diagnosis not present

## 2013-03-23 DIAGNOSIS — Z90710 Acquired absence of both cervix and uterus: Secondary | ICD-10-CM | POA: Diagnosis not present

## 2013-03-23 DIAGNOSIS — Z9079 Acquired absence of other genital organ(s): Secondary | ICD-10-CM | POA: Diagnosis not present

## 2013-04-03 ENCOUNTER — Encounter: Payer: Self-pay | Admitting: Cardiovascular Disease

## 2013-04-03 ENCOUNTER — Ambulatory Visit (INDEPENDENT_AMBULATORY_CARE_PROVIDER_SITE_OTHER): Payer: Medicare Other | Admitting: Cardiovascular Disease

## 2013-04-03 VITALS — BP 110/62 | HR 72 | Ht 63.0 in | Wt 227.8 lb

## 2013-04-03 DIAGNOSIS — I429 Cardiomyopathy, unspecified: Secondary | ICD-10-CM

## 2013-04-03 DIAGNOSIS — I10 Essential (primary) hypertension: Secondary | ICD-10-CM | POA: Diagnosis not present

## 2013-04-03 DIAGNOSIS — I2510 Atherosclerotic heart disease of native coronary artery without angina pectoris: Secondary | ICD-10-CM | POA: Diagnosis not present

## 2013-04-03 DIAGNOSIS — I080 Rheumatic disorders of both mitral and aortic valves: Secondary | ICD-10-CM

## 2013-04-03 NOTE — Progress Notes (Signed)
Patient ID: [name], female   DOB: Dec 06, 1945, 67 y.o.   MRN: [id number]   SUBJECTIVE: [name] is a very pleasant 67 year old female with a history of hypertrophic/hypertenisve cardiomyopathy and mitral regurgitation.  She has been doing very well, and denies any syncope, shortness of breath, or chest pain. Her blood pressure has been well-controlled.  Cardiovascular standpoint is otherwise completely asymptomatic.   Her echo in August 2013 revealed the following: - Left ventricle: The cavity size was normal. There was moderate concentric hypertrophy. Systolic function was normal. The estimated ejection fraction was in the range of 60% to 65%. Wall motion was normal; there were no regional wall motion abnormalities. Doppler parameters are consistent with abnormal left ventricular relaxation (grade 1 diastolic dysfunction). -Mitral valve: no significant regurgitation - Aortic valve: Transvalvular velocity was minimally increased. Valve area: 2.6cm^2(VTI). Valve area: 3.03cm^2 (Vmax). - Left atrium: The atrium was moderately dilated. - Right atrium: The atrium was mildly dilated.  Her cardiac cath was on 12/05/2006 and revealed the following: 1. Moderate pulmonary hypertension in the setting of elevated  pulmonary capillary wedge pressure.  2. At least moderate mitral regurgitation.  3. Normal left ventricular size and systolic function.  4. Minimal coronary disease.       PMH: reviewed and listed in Problem List in Electronic Records (and see below)  Past Medical History   Diagnosis  Date   .  Coronary atherosclerosis of native coronary artery    .  Secondary cardiomyopathy, unspecified    .  Essential hypertension, benign    .  Mitral valve insufficiency and aortic valve insufficiency    No past surgical history on file.    Allergies  Allergen Reactions  . Codeine     REACTION: hallucinate    Current Outpatient Prescriptions  Medication Sig Dispense  Refill  . amLODipine (NORVASC) 10 MG tablet Take 10 mg by mouth daily.       Marland Kitchen aspirin 325 MG tablet Take 325 mg by mouth daily.      . Calcium Carb-Cholecalciferol (CALCIUM + D3) 600-200 MG-UNIT TABS Take 1 tablet by mouth daily.      . cholecalciferol (VITAMIN D) 1000 UNITS tablet Take 2,000 Units by mouth daily.       . Coenzyme Q10 50 MG CAPS Take 1 capsule by mouth daily.      . Flaxseed, Linseed, (FLAXSEED OIL) 1000 MG CAPS Take 1 capsule by mouth 2 (two) times daily.      . furosemide (LASIX) 20 MG tablet Take 20 mg by mouth daily.       . Glucosamine 500 MG CAPS Take 1 capsule by mouth 2 (two) times daily.      Marland Kitchen lisinopril-hydrochlorothiazide (PRINZIDE,ZESTORETIC) 20-12.5 MG per tablet Take 1 tablet by mouth daily.      . metoprolol (LOPRESSOR) 50 MG tablet Take 0.5 tablets by mouth Twice daily.      . simvastatin (ZOCOR) 40 MG tablet Take 40 mg by mouth every evening.       No current facility-administered medications for this visit.    Past Medical History  Diagnosis Date  . Coronary atherosclerosis of native coronary artery   . Secondary cardiomyopathy, unspecified   . Essential hypertension, benign   . Mitral valve insufficiency and aortic valve insufficiency     No past surgical history on file.  History   Social History  . Marital Status: Married    Spouse Name: N/A    Number of Children: N/A  .  Years of Education: N/A   Occupational History  . Retired    Social History Main Topics  . Smoking status: Never Smoker   . Smokeless tobacco: Never Used  . Alcohol Use: No  . Drug Use: No  . Sexual Activity: Not on file   Other Topics Concern  . Not on file   Social History Narrative  . No narrative on file      Filed Vitals:   04/03/13 0817  BP: 110/62  Pulse: 72  Height: 5\' 3"  (1.6 m)  Weight: 227 lb 12.8 oz (103.329 kg)    PHYSICAL EXAM General: NAD Neck: No JVD, no thyromegaly or thyroid nodule.  Lungs: Clear to auscultation bilaterally with  normal respiratory effort. CV: Nondisplaced PMI.  Heart regular S1/S2, no S3/S4, II/VI apical holosystolic murmur.  No peripheral edema.  No carotid bruit.  Normal pedal pulses.  Abdomen: Soft, nontender, no hepatosplenomegaly, no distention.  Neurologic: Alert and oriented x 3.  Psych: Normal affect. Extremities: No clubbing or cyanosis.   ECG: reviewed and available in electronic records.      ASSESSMENT AND PLAN: 1. Hypertensive/hypertrophic cardiomyopathy: the patient is stable and asymptomatic, and on adequate medical therapy, with normal LV systolic function. 2. HTN: controlled on current therapy. No adjustments necessary. 3. Mitral regurgitation: no significant valvular regurgitation by echocardiogram in August 2013. I did appreciate an apical holosystolic murmur which would be consistent with mitral regurgitation. However, there is no need for non-invasive testing at this time given the stability of the patient's clinical status.   Prentice Docker, M.D., F.A.C.C.

## 2013-04-03 NOTE — Patient Instructions (Signed)
Continue all current medications. Your physician wants you to follow up in:  1 year.  You will receive a reminder letter in the mail one-two months in advance.  If you don't receive a letter, please call our office to schedule the follow up appointment   

## 2013-05-12 DIAGNOSIS — Z23 Encounter for immunization: Secondary | ICD-10-CM | POA: Diagnosis not present

## 2013-05-21 DIAGNOSIS — E119 Type 2 diabetes mellitus without complications: Secondary | ICD-10-CM | POA: Diagnosis not present

## 2013-05-21 DIAGNOSIS — E782 Mixed hyperlipidemia: Secondary | ICD-10-CM | POA: Diagnosis not present

## 2013-05-21 DIAGNOSIS — I10 Essential (primary) hypertension: Secondary | ICD-10-CM | POA: Diagnosis not present

## 2013-05-25 DIAGNOSIS — R609 Edema, unspecified: Secondary | ICD-10-CM | POA: Diagnosis not present

## 2013-05-25 DIAGNOSIS — E119 Type 2 diabetes mellitus without complications: Secondary | ICD-10-CM | POA: Diagnosis not present

## 2013-05-25 DIAGNOSIS — I10 Essential (primary) hypertension: Secondary | ICD-10-CM | POA: Diagnosis not present

## 2013-05-25 DIAGNOSIS — Z1331 Encounter for screening for depression: Secondary | ICD-10-CM | POA: Diagnosis not present

## 2013-05-25 DIAGNOSIS — L408 Other psoriasis: Secondary | ICD-10-CM | POA: Diagnosis not present

## 2013-05-25 DIAGNOSIS — M1990 Unspecified osteoarthritis, unspecified site: Secondary | ICD-10-CM | POA: Diagnosis not present

## 2013-05-25 DIAGNOSIS — IMO0002 Reserved for concepts with insufficient information to code with codable children: Secondary | ICD-10-CM | POA: Diagnosis not present

## 2013-05-25 DIAGNOSIS — E782 Mixed hyperlipidemia: Secondary | ICD-10-CM | POA: Diagnosis not present

## 2013-07-14 DIAGNOSIS — J019 Acute sinusitis, unspecified: Secondary | ICD-10-CM | POA: Diagnosis not present

## 2013-07-14 DIAGNOSIS — J209 Acute bronchitis, unspecified: Secondary | ICD-10-CM | POA: Diagnosis not present

## 2013-09-23 ENCOUNTER — Other Ambulatory Visit: Payer: Self-pay

## 2013-09-23 DIAGNOSIS — Z1231 Encounter for screening mammogram for malignant neoplasm of breast: Secondary | ICD-10-CM

## 2013-09-24 DIAGNOSIS — E119 Type 2 diabetes mellitus without complications: Secondary | ICD-10-CM | POA: Diagnosis not present

## 2013-09-24 DIAGNOSIS — E782 Mixed hyperlipidemia: Secondary | ICD-10-CM | POA: Diagnosis not present

## 2013-09-24 DIAGNOSIS — I10 Essential (primary) hypertension: Secondary | ICD-10-CM | POA: Diagnosis not present

## 2013-09-24 DIAGNOSIS — M1990 Unspecified osteoarthritis, unspecified site: Secondary | ICD-10-CM | POA: Diagnosis not present

## 2013-10-01 DIAGNOSIS — Z1331 Encounter for screening for depression: Secondary | ICD-10-CM | POA: Diagnosis not present

## 2013-10-01 DIAGNOSIS — E119 Type 2 diabetes mellitus without complications: Secondary | ICD-10-CM | POA: Diagnosis not present

## 2013-10-01 DIAGNOSIS — E782 Mixed hyperlipidemia: Secondary | ICD-10-CM | POA: Diagnosis not present

## 2013-10-01 DIAGNOSIS — I10 Essential (primary) hypertension: Secondary | ICD-10-CM | POA: Diagnosis not present

## 2013-10-01 DIAGNOSIS — M1990 Unspecified osteoarthritis, unspecified site: Secondary | ICD-10-CM | POA: Diagnosis not present

## 2013-10-01 DIAGNOSIS — R011 Cardiac murmur, unspecified: Secondary | ICD-10-CM | POA: Diagnosis not present

## 2013-10-01 DIAGNOSIS — L408 Other psoriasis: Secondary | ICD-10-CM | POA: Diagnosis not present

## 2013-10-01 DIAGNOSIS — R609 Edema, unspecified: Secondary | ICD-10-CM | POA: Diagnosis not present

## 2013-11-17 DIAGNOSIS — L821 Other seborrheic keratosis: Secondary | ICD-10-CM | POA: Diagnosis not present

## 2013-11-17 DIAGNOSIS — L851 Acquired keratosis [keratoderma] palmaris et plantaris: Secondary | ICD-10-CM | POA: Diagnosis not present

## 2013-11-17 DIAGNOSIS — D2360 Other benign neoplasm of skin of unspecified upper limb, including shoulder: Secondary | ICD-10-CM | POA: Diagnosis not present

## 2013-11-17 DIAGNOSIS — D485 Neoplasm of uncertain behavior of skin: Secondary | ICD-10-CM | POA: Diagnosis not present

## 2013-11-17 DIAGNOSIS — D235 Other benign neoplasm of skin of trunk: Secondary | ICD-10-CM | POA: Diagnosis not present

## 2013-11-17 DIAGNOSIS — L570 Actinic keratosis: Secondary | ICD-10-CM | POA: Diagnosis not present

## 2013-11-26 DIAGNOSIS — L919 Hypertrophic disorder of the skin, unspecified: Secondary | ICD-10-CM | POA: Diagnosis not present

## 2013-11-26 DIAGNOSIS — L570 Actinic keratosis: Secondary | ICD-10-CM | POA: Diagnosis not present

## 2013-11-26 DIAGNOSIS — D485 Neoplasm of uncertain behavior of skin: Secondary | ICD-10-CM | POA: Diagnosis not present

## 2013-11-26 DIAGNOSIS — L820 Inflamed seborrheic keratosis: Secondary | ICD-10-CM | POA: Diagnosis not present

## 2013-11-26 DIAGNOSIS — L909 Atrophic disorder of skin, unspecified: Secondary | ICD-10-CM | POA: Diagnosis not present

## 2014-01-11 ENCOUNTER — Ambulatory Visit
Admission: RE | Admit: 2014-01-11 | Discharge: 2014-01-11 | Disposition: A | Discharge status: Home / Self Care | Payer: Medicare Other | Source: Ambulatory Visit

## 2014-01-11 DIAGNOSIS — Z1231 Encounter for screening mammogram for malignant neoplasm of breast: Secondary | ICD-10-CM | POA: Diagnosis not present

## 2014-01-12 DIAGNOSIS — Z1272 Encounter for screening for malignant neoplasm of vagina: Secondary | ICD-10-CM | POA: Diagnosis not present

## 2014-01-12 DIAGNOSIS — Z87411 Personal history of vaginal dysplasia: Secondary | ICD-10-CM | POA: Diagnosis not present

## 2014-04-06 ENCOUNTER — Ambulatory Visit: Payer: Medicare Other | Admitting: Cardiology

## 2014-04-13 ENCOUNTER — Encounter: Payer: Self-pay | Admitting: Cardiovascular Disease

## 2014-04-13 ENCOUNTER — Ambulatory Visit (INDEPENDENT_AMBULATORY_CARE_PROVIDER_SITE_OTHER): Payer: Medicare Other | Admitting: Cardiovascular Disease

## 2014-04-13 VITALS — BP 132/80 | HR 69 | Ht 63.0 in | Wt 225.0 lb

## 2014-04-13 DIAGNOSIS — I119 Hypertensive heart disease without heart failure: Secondary | ICD-10-CM | POA: Diagnosis not present

## 2014-04-13 DIAGNOSIS — I2510 Atherosclerotic heart disease of native coronary artery without angina pectoris: Secondary | ICD-10-CM

## 2014-04-13 DIAGNOSIS — I422 Other hypertrophic cardiomyopathy: Secondary | ICD-10-CM

## 2014-04-13 DIAGNOSIS — I10 Essential (primary) hypertension: Secondary | ICD-10-CM | POA: Diagnosis not present

## 2014-04-13 DIAGNOSIS — I080 Rheumatic disorders of both mitral and aortic valves: Secondary | ICD-10-CM

## 2014-04-13 DIAGNOSIS — R9431 Abnormal electrocardiogram [ECG] [EKG]: Secondary | ICD-10-CM

## 2014-04-13 NOTE — Progress Notes (Signed)
Patient ID: [name], female   DOB: [birth year], 68 y.o.   MRN: [id number]      SUBJECTIVE: Mrs. Dukes is a very pleasant 68 year old female with a history of hypertrophic/hypertensive cardiomyopathy and mitral regurgitation.  She has been doing very well, and denies syncope, shortness of breath, palpitations, leg swelling and chest pain. Her blood pressure has remained well-controlled at home with readings of 116/62 mmHg. From a cardiovascular standpoint she is completely asymptomatic.  She recently her grandson move from a dormitory to an apt. She remains active.  ECG today demonstrates normal sinus rhythm, heart rate 69 beats per minute, T wave inversions in lead V4 and V5 with nonspecific T-wave abnormalities in V3 and V6 and high lateral leads.  Her echo in August 2013 revealed the following:  - Left ventricle: The cavity size was normal. There was moderate concentric hypertrophy. Systolic function was normal. The estimated ejection fraction was in the range of 60% to 65%. Wall motion was normal; there were no regional wall motion abnormalities. Doppler parameters are consistent with abnormal left ventricular relaxation (grade 1 diastolic dysfunction).  -Mitral valve: no significant regurgitation - Aortic valve: Transvalvular velocity was minimally increased. Valve area: 2.6cm^2(VTI). Valve area: 3.03cm^2 (Vmax). - Left atrium: The atrium was moderately dilated. - Right atrium: The atrium was mildly dilated.   Her cardiac cath was on 12/05/2006 and revealed the following:  1. Moderate pulmonary hypertension in the setting of elevated  pulmonary capillary wedge pressure.  2. At least moderate mitral regurgitation.  3. Normal left ventricular size and systolic function.  4. Minimal coronary disease.     Review of Systems: As per "subjective", otherwise negative.  Allergies  Allergen Reactions  . Codeine     REACTION: hallucinate    Current Outpatient  Prescriptions  Medication Sig Dispense Refill  . amLODipine (NORVASC) 10 MG tablet Take 10 mg by mouth daily.       Marland Kitchen aspirin 325 MG tablet Take 325 mg by mouth daily.      . Calcium Carb-Cholecalciferol (CALCIUM + D3) 600-200 MG-UNIT TABS Take 1 tablet by mouth daily.      . cholecalciferol (VITAMIN D) 1000 UNITS tablet Take 2,000 Units by mouth daily.       . Coenzyme Q10 50 MG CAPS Take 1 capsule by mouth daily.      . Flaxseed, Linseed, (FLAXSEED OIL) 1000 MG CAPS Take 1 capsule by mouth 2 (two) times daily.      . furosemide (LASIX) 20 MG tablet Take 20 mg by mouth daily.       . Glucosamine 500 MG CAPS Take 1 capsule by mouth 2 (two) times daily.      Marland Kitchen lisinopril-hydrochlorothiazide (PRINZIDE,ZESTORETIC) 20-12.5 MG per tablet Take 1 tablet by mouth daily.      . metoprolol (LOPRESSOR) 50 MG tablet Take 0.5 tablets by mouth Twice daily.      . simvastatin (ZOCOR) 40 MG tablet Take 40 mg by mouth every evening.       No current facility-administered medications for this visit.    Past Medical History  Diagnosis Date  . Coronary atherosclerosis of native coronary artery   . Secondary cardiomyopathy, unspecified   . Essential hypertension, benign   . Mitral valve insufficiency and aortic valve insufficiency     No past surgical history on file.  History   Social History  . Marital Status: Married    Spouse Name: N/A    Number of Children: N/A  .  Years of Education: N/A   Occupational History  . Retired    Social History Main Topics  . Smoking status: Never Smoker   . Smokeless tobacco: Never Used  . Alcohol Use: No  . Drug Use: No  . Sexual Activity: Not on file   Other Topics Concern  . Not on file   Social History Narrative  . No narrative on file     Filed Vitals:   04/13/14 0953  BP: 132/80  Pulse: 69  Height: 5\' 3"  (1.6 m)  Weight: 225 lb (102.059 kg)    PHYSICAL EXAM General: NAD  Neck: No JVD, no thyromegaly or thyroid nodule.  Lungs: Clear to  auscultation bilaterally with normal respiratory effort.  CV: Nondisplaced PMI. Heart regular S1/S2, no S3/S4, II/VI apical holosystolic murmur. No peripheral edema. No carotid bruit. Normal pedal pulses.  Abdomen: Soft, nontender, no hepatosplenomegaly, no distention.  Neurologic: Alert and oriented x 3.  Psych: Normal affect. Skin: Normal. Musculoskeletal: Normal range of motion, no gross deformities. Extremities: No clubbing or cyanosis.   ECG: Most recent ECG reviewed.      ASSESSMENT AND PLAN: 1. Hypertensive/hypertrophic cardiomyopathy: Stable and asymptomatic and on adequate medical therapy with normal LV systolic function. ECG today demonstrates perhaps more pronounced T wave inversion in leads V3 V4 and V5, and likely a consequence of hypertrophic cardiomyopathy. 2. Essential HTN: Controlled on current therapy. No adjustments necessary.  3. Mitral regurgitation: No significant valvular regurgitation by echocardiogram in August 2013. I did appreciate an apical holosystolic murmur which would be consistent with mitral regurgitation. However, there is no need for non-invasive testing at this time given the continued stability of the patient's clinical status.  Dispo: f/u 1 year.  [name], M.D., F.A.C.C.

## 2014-04-13 NOTE — Patient Instructions (Signed)
There were no changes to your medications. Continue as directed. Your physician wants you to follow up in:  1 year.  You will receive a reminder letter in the mail one-two months in advance.  If you don't receive a letter, please call our office to schedule the follow up appointment. 

## 2014-05-17 DIAGNOSIS — Z23 Encounter for immunization: Secondary | ICD-10-CM | POA: Diagnosis not present

## 2014-06-08 DIAGNOSIS — E782 Mixed hyperlipidemia: Secondary | ICD-10-CM | POA: Diagnosis not present

## 2014-06-08 DIAGNOSIS — I10 Essential (primary) hypertension: Secondary | ICD-10-CM | POA: Diagnosis not present

## 2014-06-08 DIAGNOSIS — E119 Type 2 diabetes mellitus without complications: Secondary | ICD-10-CM | POA: Diagnosis not present

## 2014-06-16 DIAGNOSIS — Z9189 Other specified personal risk factors, not elsewhere classified: Secondary | ICD-10-CM | POA: Diagnosis not present

## 2014-06-16 DIAGNOSIS — I340 Nonrheumatic mitral (valve) insufficiency: Secondary | ICD-10-CM | POA: Diagnosis not present

## 2014-06-16 DIAGNOSIS — E119 Type 2 diabetes mellitus without complications: Secondary | ICD-10-CM | POA: Diagnosis not present

## 2014-06-16 DIAGNOSIS — E782 Mixed hyperlipidemia: Secondary | ICD-10-CM | POA: Diagnosis not present

## 2014-06-16 DIAGNOSIS — L408 Other psoriasis: Secondary | ICD-10-CM | POA: Diagnosis not present

## 2014-06-16 DIAGNOSIS — I10 Essential (primary) hypertension: Secondary | ICD-10-CM | POA: Diagnosis not present

## 2014-07-17 DIAGNOSIS — R42 Dizziness and giddiness: Secondary | ICD-10-CM | POA: Diagnosis not present

## 2014-07-17 DIAGNOSIS — I480 Paroxysmal atrial fibrillation: Secondary | ICD-10-CM | POA: Diagnosis not present

## 2014-07-17 DIAGNOSIS — Z885 Allergy status to narcotic agent status: Secondary | ICD-10-CM | POA: Diagnosis not present

## 2014-07-17 DIAGNOSIS — I214 Non-ST elevation (NSTEMI) myocardial infarction: Secondary | ICD-10-CM | POA: Diagnosis not present

## 2014-07-17 DIAGNOSIS — Z836 Family history of other diseases of the respiratory system: Secondary | ICD-10-CM | POA: Diagnosis not present

## 2014-07-17 DIAGNOSIS — R079 Chest pain, unspecified: Secondary | ICD-10-CM | POA: Diagnosis not present

## 2014-07-17 DIAGNOSIS — I358 Other nonrheumatic aortic valve disorders: Secondary | ICD-10-CM | POA: Diagnosis not present

## 2014-07-17 DIAGNOSIS — R0789 Other chest pain: Secondary | ICD-10-CM | POA: Diagnosis not present

## 2014-07-17 DIAGNOSIS — I4901 Ventricular fibrillation: Secondary | ICD-10-CM | POA: Diagnosis not present

## 2014-07-17 DIAGNOSIS — Z6839 Body mass index (BMI) 39.0-39.9, adult: Secondary | ICD-10-CM | POA: Diagnosis not present

## 2014-07-17 DIAGNOSIS — I10 Essential (primary) hypertension: Secondary | ICD-10-CM | POA: Diagnosis not present

## 2014-07-17 DIAGNOSIS — M1990 Unspecified osteoarthritis, unspecified site: Secondary | ICD-10-CM | POA: Diagnosis not present

## 2014-07-17 DIAGNOSIS — I4891 Unspecified atrial fibrillation: Secondary | ICD-10-CM | POA: Diagnosis not present

## 2014-07-17 DIAGNOSIS — E876 Hypokalemia: Secondary | ICD-10-CM | POA: Diagnosis not present

## 2014-07-18 ENCOUNTER — Encounter (HOSPITAL_COMMUNITY): Payer: Self-pay | Admitting: Internal Medicine

## 2014-07-18 ENCOUNTER — Inpatient Hospital Stay (HOSPITAL_COMMUNITY)
Admission: AD | Admit: 2014-07-18 | Discharge: 2014-07-20 | DRG: 281 | Disposition: A | Discharge status: Home / Self Care | Payer: Medicare Other | Source: Other Acute Inpatient Hospital | Attending: Internal Medicine | Admitting: Internal Medicine

## 2014-07-18 DIAGNOSIS — I480 Paroxysmal atrial fibrillation: Secondary | ICD-10-CM | POA: Diagnosis not present

## 2014-07-18 DIAGNOSIS — I481 Persistent atrial fibrillation: Secondary | ICD-10-CM | POA: Diagnosis present

## 2014-07-18 DIAGNOSIS — Z8673 Personal history of transient ischemic attack (TIA), and cerebral infarction without residual deficits: Secondary | ICD-10-CM | POA: Diagnosis not present

## 2014-07-18 DIAGNOSIS — E781 Pure hyperglyceridemia: Secondary | ICD-10-CM | POA: Diagnosis present

## 2014-07-18 DIAGNOSIS — I2510 Atherosclerotic heart disease of native coronary artery without angina pectoris: Secondary | ICD-10-CM | POA: Diagnosis present

## 2014-07-18 DIAGNOSIS — R079 Chest pain, unspecified: Secondary | ICD-10-CM | POA: Diagnosis not present

## 2014-07-18 DIAGNOSIS — I214 Non-ST elevation (NSTEMI) myocardial infarction: Principal | ICD-10-CM | POA: Diagnosis present

## 2014-07-18 DIAGNOSIS — R7989 Other specified abnormal findings of blood chemistry: Secondary | ICD-10-CM | POA: Diagnosis present

## 2014-07-18 DIAGNOSIS — Z885 Allergy status to narcotic agent status: Secondary | ICD-10-CM

## 2014-07-18 DIAGNOSIS — E876 Hypokalemia: Secondary | ICD-10-CM | POA: Diagnosis not present

## 2014-07-18 DIAGNOSIS — I10 Essential (primary) hypertension: Secondary | ICD-10-CM | POA: Diagnosis not present

## 2014-07-18 DIAGNOSIS — I272 Other secondary pulmonary hypertension: Secondary | ICD-10-CM | POA: Diagnosis present

## 2014-07-18 DIAGNOSIS — I369 Nonrheumatic tricuspid valve disorder, unspecified: Secondary | ICD-10-CM | POA: Diagnosis not present

## 2014-07-18 DIAGNOSIS — I248 Other forms of acute ischemic heart disease: Secondary | ICD-10-CM | POA: Diagnosis not present

## 2014-07-18 DIAGNOSIS — E669 Obesity, unspecified: Secondary | ICD-10-CM | POA: Diagnosis present

## 2014-07-18 DIAGNOSIS — R778 Other specified abnormalities of plasma proteins: Secondary | ICD-10-CM

## 2014-07-18 DIAGNOSIS — Z6839 Body mass index (BMI) 39.0-39.9, adult: Secondary | ICD-10-CM | POA: Diagnosis not present

## 2014-07-18 DIAGNOSIS — I119 Hypertensive heart disease without heart failure: Secondary | ICD-10-CM

## 2014-07-18 DIAGNOSIS — I2489 Other forms of acute ischemic heart disease: Secondary | ICD-10-CM | POA: Diagnosis present

## 2014-07-18 DIAGNOSIS — I4819 Other persistent atrial fibrillation: Secondary | ICD-10-CM | POA: Diagnosis present

## 2014-07-18 HISTORY — DX: Hypertensive heart disease without heart failure: I11.9

## 2014-07-18 HISTORY — DX: Cardiomegaly: I51.7

## 2014-07-18 HISTORY — DX: Obesity, unspecified: E66.9

## 2014-07-18 HISTORY — DX: Atherosclerotic heart disease of native coronary artery without angina pectoris: I25.10

## 2014-07-18 LAB — TROPONIN I: Troponin I: 0.38 ng/mL (ref <0.30)

## 2014-07-18 LAB — MRSA PCR SCREENING: MRSA by PCR: NEGATIVE

## 2014-07-18 MED ORDER — LISINOPRIL 20 MG PO TABS
20.0000 mg | ORAL_TABLET | Freq: Every day | ORAL | Status: DC
  Administered 2014-07-19 – 2014-07-20 (×2): 20 mg via ORAL
  Filled 2014-07-18 (×2): qty 1

## 2014-07-18 MED ORDER — ONDANSETRON HCL 4 MG/2ML IJ SOLN: 4.0000 mg | Freq: Four times a day (QID) | INTRAMUSCULAR | Status: DC | PRN

## 2014-07-18 MED ORDER — HEPARIN BOLUS VIA INFUSION
4000.0000 [IU] | Freq: Once | INTRAVENOUS | Status: AC
  Administered 2014-07-18: 4000 [IU] via INTRAVENOUS
  Filled 2014-07-18: qty 4000

## 2014-07-18 MED ORDER — HEPARIN (PORCINE) IN NACL 100-0.45 UNIT/ML-% IJ SOLN
1300.0000 [IU]/h | INTRAMUSCULAR | Status: DC
  Administered 2014-07-18 – 2014-07-19 (×2): 1300 [IU]/h via INTRAVENOUS
  Filled 2014-07-18 (×2): qty 250

## 2014-07-18 MED ORDER — SODIUM CHLORIDE 0.9 % IJ SOLN
3.0000 mL | Freq: Two times a day (BID) | INTRAMUSCULAR | Status: DC
  Administered 2014-07-19 – 2014-07-20 (×3): 3 mL via INTRAVENOUS

## 2014-07-18 MED ORDER — AMLODIPINE BESYLATE 10 MG PO TABS
10.0000 mg | ORAL_TABLET | Freq: Every day | ORAL | Status: DC
  Administered 2014-07-19: 10 mg via ORAL
  Filled 2014-07-18: qty 1

## 2014-07-18 MED ORDER — LISINOPRIL-HYDROCHLOROTHIAZIDE 20-12.5 MG PO TABS: 1.0000 | ORAL_TABLET | Freq: Every day | ORAL | Status: DC

## 2014-07-18 MED ORDER — ASPIRIN EC 81 MG PO TBEC
81.0000 mg | DELAYED_RELEASE_TABLET | Freq: Every day | ORAL | Status: DC
  Administered 2014-07-20: 81 mg via ORAL
  Filled 2014-07-18: qty 1

## 2014-07-18 MED ORDER — METOPROLOL TARTRATE 50 MG PO TABS
50.0000 mg | ORAL_TABLET | Freq: Two times a day (BID) | ORAL | Status: DC
  Administered 2014-07-19: 50 mg via ORAL
  Filled 2014-07-18: qty 1

## 2014-07-18 MED ORDER — HYDROCHLOROTHIAZIDE 12.5 MG PO CAPS
12.5000 mg | ORAL_CAPSULE | Freq: Every day | ORAL | Status: DC
  Administered 2014-07-19 – 2014-07-20 (×2): 12.5 mg via ORAL
  Filled 2014-07-18 (×2): qty 1

## 2014-07-18 MED ORDER — SODIUM CHLORIDE 0.9 % IV SOLN: 250.0000 mL | INTRAVENOUS | Status: DC | PRN

## 2014-07-18 MED ORDER — ACETAMINOPHEN 325 MG PO TABS: 650.0000 mg | ORAL_TABLET | ORAL | Status: DC | PRN

## 2014-07-18 MED ORDER — DILTIAZEM HCL 100 MG IV SOLR
5.0000 mg/h | INTRAVENOUS | Status: DC
  Administered 2014-07-18: 5 mg/h via INTRAVENOUS
  Filled 2014-07-18: qty 100

## 2014-07-18 MED ORDER — SIMVASTATIN 40 MG PO TABS
40.0000 mg | ORAL_TABLET | Freq: Every evening | ORAL | Status: DC
  Administered 2014-07-19: 40 mg via ORAL
  Filled 2014-07-18: qty 1

## 2014-07-18 MED ORDER — SODIUM CHLORIDE 0.9 % IJ SOLN: 3.0000 mL | INTRAMUSCULAR | Status: DC | PRN

## 2014-07-18 NOTE — H&P (Addendum)
CARDIOLOGY ADMIT NOTE     Primary Care Physician: Gar Ponto, MD  Primary Cardiologist:  Dr Bronson Ing  Referring Physician:  Dr Marya Amsler Date: 07/18/2014  Reason for consultation:  Afib, + troponin, CP  [name] is a 68 y.o. female with a h/o moderate LA enlargement, obesity and HTN who presents on transfer from [facility] for consideration of cath.  She reports developing symptoms of chest heaviness/ discomfort which she describes as moderate in intensity yesterday.  She called EMS and was noted to have Afib with RVR.  She presented to [facility] where she was noted to have ST depression and a troponin of 0.8  She is transferred to [facility] for further management.  She continues to have mild chest heaviness.  She underwent cath 2008 which revealed mild [name] at that time.  She has moderate LVH on prior echo which is felt to be due to hypertensive cardiovascular disease.  Today, she denies symptoms of palpitations, shortness of breath, orthopnea, PND, lower extremity edema, dizziness, presyncope, syncope, or neurologic sequela.  She does snore and her spouse suspects sleep apnea. The patient is tolerating medications without difficulties and is otherwise without complaint today.   Past Medical History  Diagnosis Date  . Obesity   . Hypertensive cardiovascular disease   . Left atrial dilatation 2013  . [name] (coronary artery disease) 2008    mild   Past Surgical History  Procedure Laterality Date  . Abdominal hysterectomy      Home meds and allergies reviewed   Allergies  Allergen Reactions  . Codeine     REACTION: hallucinate    History   Social History  . Marital Status: Married    Spouse Name: N/A    Number of Children: N/A  . Years of Education: N/A   Occupational History  . Retired    Social History Main Topics  . Smoking status: Never Smoker   . Smokeless tobacco: Never Used  . Alcohol Use: No  . Drug Use: No  . Sexual  Activity: Not on file   Other Topics Concern  . Not on file   Social History Narrative   Lives in [place]   Retired    Family History  Problem Relation Age of Onset  . Hypertension      ROS- All systems are reviewed and negative except as per the HPI above  Physical Exam: Telemetry: Filed Vitals:   07/18/14 1654  BP: 138/92  Pulse: 115  Temp: 98.1 F (36.7 C)  TempSrc: Oral  Resp: 18  Height: 5\' 4"  (1.626 m)  Weight: 229 lb 4.8 oz (104.01 kg)  SpO2: 96%    GEN- The patient is overweight appearing, alert and oriented x 3 today.   Head- normocephalic, atraumatic Eyes-  Sclera clear, conjunctiva pink Ears- hearing intact Oropharynx- clear Neck- supple, no JVP Lymph- no cervical lymphadenopathy Lungs- Clear to ausculation bilaterally, normal work of breathing Heart- irregular rate and rhythm, no murmurs, rubs or gallops, PMI not laterally displaced GI- soft, NT, ND, + BS Extremities- no clubbing, cyanosis, or edema MS- no significant deformity or atrophy Skin- no rash or lesion Psych- euthymic mood, full affect Neuro- strength and sensation are intact  EKG from [facility] is not available.  Rhythm strip reveals afib with RVR  Labs: [place]head labs are reviewed Notable is troponin 0.8   ASSESSMENT AND PLAN:   1. NSTEMI She has troponin elevation in the setting of AF with RVR.  I am  concerned that this may represent progression of prior [name] on cath 2008.  I agree with Dr Quillian Quince that cath is indicated at this time.  Will place on ASA and cycle CMs overnight.  Check lipids and rate control AF with beta blockers. Discussed the cath with the patient. The patient understands that risks included but are not limited to stroke (1 in 1000), death (1 in 56), kidney failure [usually temporary] (1 in 500), bleeding (1 in 200), allergic reaction [possibly serious] (1 in 200). The patient understands and agrees to proceed.  Place on heparin drip.   2. AF The patient presents with  persistent AF.  She has symptoms of chest discomfort and elevated troponin likely due to elevated V rates.  Initiate metoprolol for rate control.  Her chads2vasc score is at least 3.  I will wait until after cath to start anticoagulation.   Heparin drip for now.  Post cath, would plan to transition to a NOAC. She had moderate LA enlargement and pulmonary hypertension oin 2013.  This is likely due to obesity, hypertension, and possibly sleep apnea.  Lifestyle modification is necessary long term.  I worry that her AF may be difficult to treat.  Check TFTs  3. Obesity Weight loss advised Body mass index is 39.34 kg/(m^2).  4. Snoring Will need outpatient sleep study  5. Hypertensive cardiovascular disease Moderate LVH on prior echo (2013) Repeat echo at this time Start metoprolol for rate control Resume home antihypertensives  6. HL Resume simvastatin Check fasting lipids   Thompson Grayer, MD 07/18/2014  7:49 PM

## 2014-07-18 NOTE — Progress Notes (Signed)
ANTICOAGULATION CONSULT NOTE - Initial Consult  Pharmacy Consult for Heparin Indication: Chest pain and Afib  Allergies  Allergen Reactions  . Codeine     REACTION: hallucinate    Patient Measurements: Height: 5\' 4"  (162.6 cm) Weight: 229 lb 4.8 oz (104.01 kg) IBW/kg (Calculated) : 54.7 Heparin Dosing Weight: 75  Vital Signs: Temp: 98.1 F (36.7 C) (12/13 1654) Temp Source: Oral (12/13 1654) BP: 138/92 mmHg (12/13 1654) Pulse Rate: 115 (12/13 1654)  Labs: No results for input(s): HGB, HCT, PLT, APTT, LABPROT, INR, HEPARINUNFRC, CREATININE, CKTOTAL, CKMB, TROPONINI in the last 72 hours.  CrCl cannot be calculated (Patient has no serum creatinine result on file.).   Medical History: Past Medical History  Diagnosis Date  . Obesity   . Hypertensive cardiovascular disease   . Left atrial dilatation 2013  . [name] (coronary artery disease) 2008    mild    Assessment: 68 year old female admitted with Afib and NSTEMI.  She has a history of [name] with a prior cath in 2008.  Pharmacy asked to begin IV heparin in preparation of cath with transition to po anti-coag afterwards.  Goal of Therapy:  Heparin level 0.3-0.7 units/ml Monitor platelets by anticoagulation protocol: Yes   Plan:  Heparin 4000 units iv bolus x 1 Heparin drip at 1300 units / hr Daily heparin level, CBC  Thank you. Anette Guarneri, PharmD [phone]  07/18/2014,8:11 PM

## 2014-07-19 ENCOUNTER — Encounter (HOSPITAL_COMMUNITY): Payer: Self-pay | Admitting: *Deleted

## 2014-07-19 ENCOUNTER — Encounter (HOSPITAL_COMMUNITY)
Admission: AD | Disposition: A | Discharge status: Home / Self Care | Payer: Self-pay | Source: Other Acute Inpatient Hospital | Attending: Internal Medicine

## 2014-07-19 DIAGNOSIS — I369 Nonrheumatic tricuspid valve disorder, unspecified: Secondary | ICD-10-CM

## 2014-07-19 DIAGNOSIS — E781 Pure hyperglyceridemia: Secondary | ICD-10-CM

## 2014-07-19 DIAGNOSIS — R7989 Other specified abnormal findings of blood chemistry: Secondary | ICD-10-CM | POA: Diagnosis present

## 2014-07-19 DIAGNOSIS — R778 Other specified abnormalities of plasma proteins: Secondary | ICD-10-CM | POA: Diagnosis present

## 2014-07-19 DIAGNOSIS — I248 Other forms of acute ischemic heart disease: Secondary | ICD-10-CM | POA: Diagnosis present

## 2014-07-19 DIAGNOSIS — R079 Chest pain, unspecified: Secondary | ICD-10-CM | POA: Diagnosis present

## 2014-07-19 DIAGNOSIS — I2510 Atherosclerotic heart disease of native coronary artery without angina pectoris: Secondary | ICD-10-CM

## 2014-07-19 HISTORY — PX: LEFT HEART CATHETERIZATION WITH CORONARY ANGIOGRAM: SHX5451

## 2014-07-19 LAB — CBC
HCT: 39.1 % (ref 36.0–46.0)
HEMOGLOBIN: 12.8 g/dL (ref 12.0–15.0)
MCH: 27.2 pg (ref 26.0–34.0)
MCHC: 32.7 g/dL (ref 30.0–36.0)
MCV: 83 fL (ref 78.0–100.0)
PLATELETS: 194 10*3/uL (ref 150–400)
RBC: 4.71 MIL/uL (ref 3.87–5.11)
RDW: 13.4 % (ref 11.5–15.5)
WBC: 5.9 10*3/uL (ref 4.0–10.5)

## 2014-07-19 LAB — BASIC METABOLIC PANEL
ANION GAP: 13 (ref 5–15)
BUN: 8 mg/dL (ref 6–23)
CALCIUM: 8.9 mg/dL (ref 8.4–10.5)
CHLORIDE: 105 meq/L (ref 96–112)
CO2: 22 mEq/L (ref 19–32)
Creatinine, Ser: 0.49 mg/dL — ABNORMAL LOW (ref 0.50–1.10)
GFR calc Af Amer: >90 mL/min (ref >90)
GFR calc non Af Amer: >90 mL/min (ref >90)
Glucose, Bld: 118 mg/dL — ABNORMAL HIGH (ref 70–99)
Potassium: 4.5 mEq/L (ref 3.7–5.3)
Sodium: 140 mEq/L (ref 137–147)

## 2014-07-19 LAB — HEPARIN LEVEL (UNFRACTIONATED): Heparin Unfractionated: 0.31 IU/mL (ref 0.30–0.70)

## 2014-07-19 LAB — TSH: TSH: 2.66 u[IU]/mL (ref 0.350–4.500)

## 2014-07-19 LAB — TROPONIN I: Troponin I: <0.3 ng/mL (ref <0.30)

## 2014-07-19 LAB — LIPID PANEL
CHOLESTEROL: 146 mg/dL (ref 0–200)
HDL: 32 mg/dL — ABNORMAL LOW (ref >39)
LDL Cholesterol: 67 mg/dL (ref 0–99)
TRIGLYCERIDES: 234 mg/dL — AB (ref <150)
Total CHOL/HDL Ratio: 4.6 RATIO
VLDL: 47 mg/dL — ABNORMAL HIGH (ref 0–40)

## 2014-07-19 SURGERY — LEFT HEART CATHETERIZATION WITH CORONARY ANGIOGRAM
Anesthesia: LOCAL

## 2014-07-19 MED ORDER — ASPIRIN 81 MG PO CHEW
81.0000 mg | CHEWABLE_TABLET | ORAL | Status: AC
  Administered 2014-07-19: 81 mg via ORAL
  Filled 2014-07-19: qty 1

## 2014-07-19 MED ORDER — SODIUM CHLORIDE 0.9 % IJ SOLN: 3.0000 mL | INTRAMUSCULAR | Status: DC | PRN

## 2014-07-19 MED ORDER — OFF THE BEAT BOOK
Freq: Once | Status: AC
  Administered 2014-07-19: 13:00:00
  Filled 2014-07-19: qty 1

## 2014-07-19 MED ORDER — SODIUM CHLORIDE 0.9 % IJ SOLN
3.0000 mL | Freq: Two times a day (BID) | INTRAMUSCULAR | Status: DC
  Administered 2014-07-20: 3 mL via INTRAVENOUS

## 2014-07-19 MED ORDER — DILTIAZEM HCL 60 MG PO TABS
60.0000 mg | ORAL_TABLET | Freq: Four times a day (QID) | ORAL | Status: DC
  Administered 2014-07-19 – 2014-07-20 (×4): 60 mg via ORAL
  Filled 2014-07-19 (×4): qty 1

## 2014-07-19 MED ORDER — DILTIAZEM HCL ER 60 MG PO CP12: 60.0000 mg | ORAL_CAPSULE | Freq: Three times a day (TID) | ORAL | Status: DC

## 2014-07-19 MED ORDER — METOPROLOL TARTRATE 50 MG PO TABS
75.0000 mg | ORAL_TABLET | Freq: Two times a day (BID) | ORAL | Status: DC
  Administered 2014-07-19 – 2014-07-20 (×3): 75 mg via ORAL
  Filled 2014-07-19 (×6): qty 1

## 2014-07-19 MED ORDER — SODIUM CHLORIDE 0.9 % IV SOLN: 1.0000 mL/kg/h | INTRAVENOUS | Status: AC

## 2014-07-19 MED ORDER — MIDAZOLAM HCL 2 MG/2ML IJ SOLN
INTRAMUSCULAR | Status: AC
  Filled 2014-07-19: qty 2

## 2014-07-19 MED ORDER — ATORVASTATIN CALCIUM 20 MG PO TABS
20.0000 mg | ORAL_TABLET | Freq: Every day | ORAL | Status: DC
  Administered 2014-07-19: 20 mg via ORAL
  Filled 2014-07-19: qty 1

## 2014-07-19 MED ORDER — SODIUM CHLORIDE 0.9 % IV SOLN: 250.0000 mL | INTRAVENOUS | Status: DC | PRN

## 2014-07-19 MED ORDER — SODIUM CHLORIDE 0.9 % IJ SOLN: 3.0000 mL | Freq: Two times a day (BID) | INTRAMUSCULAR | Status: DC

## 2014-07-19 MED ORDER — NITROGLYCERIN 1 MG/10 ML FOR IR/CATH LAB
INTRA_ARTERIAL | Status: AC
  Filled 2014-07-19: qty 10

## 2014-07-19 MED ORDER — APIXABAN 5 MG PO TABS
5.0000 mg | ORAL_TABLET | Freq: Two times a day (BID) | ORAL | Status: DC
  Administered 2014-07-19 – 2014-07-20 (×2): 5 mg via ORAL
  Filled 2014-07-19 (×2): qty 1

## 2014-07-19 MED ORDER — VERAPAMIL HCL 2.5 MG/ML IV SOLN
INTRAVENOUS | Status: AC
  Filled 2014-07-19: qty 2

## 2014-07-19 MED ORDER — DILTIAZEM HCL 100 MG IV SOLR
5.0000 mg/h | INTRAVENOUS | Status: DC
  Filled 2014-07-19: qty 100

## 2014-07-19 MED ORDER — HEPARIN SODIUM (PORCINE) 1000 UNIT/ML IJ SOLN
INTRAMUSCULAR | Status: AC
  Filled 2014-07-19: qty 1

## 2014-07-19 MED ORDER — SODIUM CHLORIDE 0.9 % IV SOLN
1.0000 mL/kg/h | INTRAVENOUS | Status: DC
  Administered 2014-07-19 (×2): 1 mL/kg/h via INTRAVENOUS

## 2014-07-19 MED ORDER — FENTANYL CITRATE 0.05 MG/ML IJ SOLN
INTRAMUSCULAR | Status: AC
  Filled 2014-07-19: qty 2

## 2014-07-19 MED ORDER — HEPARIN (PORCINE) IN NACL 2-0.9 UNIT/ML-% IJ SOLN
INTRAMUSCULAR | Status: AC
  Filled 2014-07-19: qty 1000

## 2014-07-19 MED ORDER — LIDOCAINE HCL (PF) 1 % IJ SOLN
INTRAMUSCULAR | Status: AC
  Filled 2014-07-19: qty 30

## 2014-07-19 NOTE — CV Procedure (Signed)
    Cardiac Catheterization Procedure Note  Name: [name] MRN: [id number] DOB: October 24, 1945  Procedure: Left Heart Cath, Selective Coronary Angiography  Indication: AFib with RVR, elevated troponin   Procedural Details: The right wrist was prepped, draped, and anesthetized with 1% lidocaine. Using the modified Seldinger technique, a 5/6 French Slender sheath was introduced into the right radial artery. 3 mg of verapamil was administered through the sheath, weight-based unfractionated heparin was administered intravenously. Standard Judkins catheters were used for selective coronary angiography. Catheter exchanges were performed over an exchange length guidewire. There were no immediate procedural complications. A TR band was used for radial hemostasis at the completion of the procedure.  The patient was transferred to the post catheterization recovery area for further monitoring.  Procedural Findings: Hemodynamics: AO 109-68  Coronary angiography: Coronary dominance: right  Left mainstem: Widely patent, no obstructive disease  Left anterior descending (LAD): Patent throughout. Medium caliber vessel, extends to the distal anterior wall. There are 2 diagonal branches, both small in caliber, without significant stenosis. There are minor diffuse irregularities noted.   Left circumflex (LCx): Large caliber vessel, 2 OM branches are widely patent without obstruction.  Right coronary artery (RCA): Diffuse nonobstructive disease and mild diffuse calcification noted. The PDA and PLA branches are small but patent. The mid-RCA has 30% stenosis. No high-grade obstructive disease noted.   Left ventriculography: deferred. LVEF normal by echo with severe LVH.   Estimated Blood Loss: minimal  Final Conclusions:   1. Mild diffuse nonobstructive [name] 2. Normal LV function by echo  Recommendations: add diltiazem for better rate-control of AF. Add Eliquis for anticoagulation. May need  DCCV.  Sherren Mocha MD, [facility] 07/19/2014, 4:23 PM

## 2014-07-19 NOTE — Progress Notes (Signed)
Pt back from the cath lab. Per cath lab report, pt received a skin tear after tape removal. Will continue to monitor

## 2014-07-19 NOTE — Progress Notes (Signed)
  Echocardiogram 2D Echocardiogram has been performed.  [name] 07/19/2014, 12:03 PM

## 2014-07-19 NOTE — Progress Notes (Signed)
Patient Name: [name] Date of Encounter: 07/19/2014  Principal Problem:   Chest pain, moderate coronary artery risk Active Problems:   Essential hypertension, benign   Persistent atrial fibrillation   Hypertriglyceridemia   Primary Cardiologist: Bronson Ing  Patient Profile: 68 yo female w/ hx mod LA, obesity, HTN, mod [name], admitted from [facility] 12/13 w/ chest pain, afib/RVR.  SUBJECTIVE: No more chest pain, never had palpitations.  OBJECTIVE Filed Vitals:   07/18/14 1654 07/18/14 2100 07/19/14 0012 07/19/14 0500  BP: 138/92 132/72 125/65 155/76  Pulse: 115 68 97 67  Temp: 98.1 F (36.7 C) 98.2 F (36.8 C)  98.4 F (36.9 C)  TempSrc: Oral     Resp: 18 21  18   Height: 5\' 4"  (1.626 m)     Weight: 229 lb 4.8 oz (104.01 kg)   226 lb (102.513 kg)  SpO2: 96% 97%  95%    Intake/Output Summary (Last 24 hours) at 07/19/14 0731 Last data filed at 07/19/14 0600  Gross per 24 hour  Intake 960.56 ml  Output    400 ml  Net 560.56 ml   Filed Weights   07/18/14 1654 07/19/14 0500  Weight: 229 lb 4.8 oz (104.01 kg) 226 lb (102.513 kg)    PHYSICAL EXAM General: Well developed, well nourished, female in no acute distress. Head: Normocephalic, atraumatic.  Neck: Supple without bruits, JVD not elevated Lungs:  Resp regular and unlabored, CTA. Heart: Irreg Irreg,  S1, S2, no S3, S4, 2/6 murmur; no rub. Abdomen: Soft, non-tender, non-distended, BS + x 4.  Extremities: No clubbing, cyanosis, no edema.  Neuro: Alert and oriented X 3. Moves all extremities spontaneously. Psych: Normal affect.  LABS: Cardiac Enzymes:  Recent Labs  07/18/14 2046 07/19/14 0213  TROPONINI 0.38* <0.30   Fasting Lipid Panel:  Recent Labs  07/19/14 0213  CHOL 146  HDL 32*  LDLCALC 67  TRIG 234*  CHOLHDL 4.6   Thyroid Function Tests:  Recent Labs  07/19/14 0213  TSH 2.660   TELE:   Atrial fib, rate improved     Current Medications:  . amLODipine  10 mg Oral Daily  .  aspirin EC  81 mg Oral Daily  . lisinopril  20 mg Oral Daily   And  . hydrochlorothiazide  12.5 mg Oral Daily  . metoprolol tartrate  50 mg Oral BID  . simvastatin  40 mg Oral QPM  . sodium chloride  3 mL Intravenous Q12H  . sodium chloride  3 mL Intravenous Q12H   . [START ON 07/20/2014] sodium chloride 1 mL/kg/hr (07/19/14 0405)  . diltiazem (CARDIZEM) infusion 5 mg/hr (07/18/14 2130)  . heparin 1,300 Units/hr (07/18/14 2052)    ASSESSMENT AND PLAN: Principal Problem:   Chest pain, moderate coronary artery risk - minimal troponin elevation initially, in the setting of rapid atrial fib. Ez have improved, no more symptoms once HR improved. Hx mod [name] in past, cath today to assess anatomy, will be this pm.  Active Problems:   Essential hypertension, benign - SBP 130s-150s, but is on 2 CCBs. Increase metoprolol to 75 mg bid, d/c IV Diltiazem, continue amlodipine, lisinopril/HCTZ    Persistent atrial fibrillation - rate improved on Cardizem, but needs amlodipine for BP control. Increase BB and d/c Cardizem    Hypertriglyceridemia - pt says has had elevated CBG in past, check BMET today    Anticoagulation - CHADS2VASC is 3, would look into NOAC cost for her.   Plan - d/c once medically  stable.   Jonetta Speak , PA-C 7:31 AM 07/19/2014

## 2014-07-19 NOTE — Progress Notes (Signed)
CRITICAL VALUE ALERT  Critical value received:  Troponin 0.38   Date of notification:  07/18/14  Time of notification:  07/18/14     Critical value read back: yes     Nurse who received alert:  Renita Papa    MD notified (1st page):  Wynonia Lawman  Time of first page:  2050 On floor, personally notified   MD notified (2nd page):  Time of second page:  Responding MD:  Wynonia Lawman  Time MD responded:  2050

## 2014-07-19 NOTE — Progress Notes (Signed)
ANTICOAGULATION CONSULT NOTE - Follow Up Consult  Pharmacy Consult for eliquis Indication: atrial fibrillation  Allergies  Allergen Reactions  . Codeine     REACTION: hallucinate    Patient Measurements: Height: 5\' 4"  (162.6 cm) Weight: 226 lb (102.513 kg) IBW/kg (Calculated) : 54.7 Heparin Dosing Weight:    Vital Signs: Temp: 98.4 F (36.9 C) (12/14 1643) Temp Source: Oral (12/14 1643) BP: 110/66 mmHg (12/14 1643) Pulse Rate: 88 (12/14 1643)  Labs:  Recent Labs  07/18/14 2046 07/19/14 0213 07/19/14 0800  HGB  --   --  12.8  HCT  --   --  39.1  PLT  --   --  194  HEPARINUNFRC  --  0.31  --   CREATININE  --   --  0.49*  TROPONINI 0.38* <0.30 <0.30    Estimated Creatinine Clearance: 78.4 mL/min (by C-G formula based on Cr of 0.49).   Assessment: 68 year old female admitted with Afib and NSTEMI. She has a history of [name] with a prior cath in 2008. Pharmacy was asked to begin IV heparin in preparation of cath with transition to po anti-coag afterwards.  Anticoagulation: CP/ACS, afib - CHADS2VASC is 3. CrCl 78. Plan to start Eliquis post-cath (radial).  Cath: Nonobstructive [name]. Normal LV  Goal of Therapy:  therapeutic oral anticoagulation Monitor platelets by anticoagulation protocol: Yes   Plan:  Eliquis 5mg  BID Start 2 hrs after TR band removal.  Solomon Skowronek S. Alford [facility], PharmD, BCPS Clinical Staff Pharmacist Pager [phone]  Eilene Ghazi Stillinger 07/19/2014,4:56 PM

## 2014-07-19 NOTE — Interval H&P Note (Signed)
History and Physical Interval Note:  07/19/2014 3:36 PM  [name]  has presented today for surgery, with the diagnosis of unstable angina  The various methods of treatment have been discussed with the patient and family. After consideration of risks, benefits and other options for treatment, the patient has consented to  Procedure(s): LEFT HEART CATHETERIZATION WITH CORONARY ANGIOGRAM (N/A) as a surgical intervention .  The patient's history has been reviewed, patient examined, no change in status, stable for surgery.  I have reviewed the patient's chart and labs.  Questions were answered to the patient's satisfaction.    Cath Lab Visit (complete for each Cath Lab visit)  Clinical Evaluation Leading to the Procedure:   ACS: Yes.    Non-ACS:    Anginal Classification: CCS III  Anti-ischemic medical therapy: Maximal Therapy (2 or more classes of medications)  Non-Invasive Test Results: No non-invasive testing performed  Prior CABG: No previous CABG       Sherren Mocha

## 2014-07-19 NOTE — Progress Notes (Signed)
UR Completed Davette Nugent Graves-Bigelow, RN,BSN [phone]  

## 2014-07-19 NOTE — Progress Notes (Signed)
[place] for Heparin Indication: Chest pain and Afib  Allergies  Allergen Reactions  . Codeine     REACTION: hallucinate    Patient Measurements: Height: 5\' 4"  (162.6 cm) Weight: 229 lb 4.8 oz (104.01 kg) IBW/kg (Calculated) : 54.7 Heparin Dosing Weight: 75  Vital Signs: Temp: 98.2 F (36.8 C) (12/13 2100) Temp Source: Oral (12/13 1654) BP: 125/65 mmHg (12/14 0012) Pulse Rate: 68 (12/13 2100)  Labs:  Recent Labs  07/18/14 2046 07/19/14 0213  HEPARINUNFRC  --  0.31  TROPONINI 0.38* <0.30    CrCl cannot be calculated (Patient has no serum creatinine result on file.).   Assessment: 68 year old female with Afib and NSTEMI for heparin  Goal of Therapy:  Heparin level 0.3-0.7 units/ml Monitor platelets by anticoagulation protocol: Yes   Plan:  Continue Heparin at current rate  F/U after cath  Phillis Knack, PharmD, BCPS   07/19/2014,3:15 AM

## 2014-07-19 NOTE — H&P (View-Only) (Signed)
Patient Name: [name] Date of Encounter: 07/19/2014  Principal Problem:   Chest pain, moderate coronary artery risk Active Problems:   Essential hypertension, benign   Persistent atrial fibrillation   Hypertriglyceridemia   Primary Cardiologist: Bronson Ing  Patient Profile: 68 yo female w/ hx mod LA, obesity, HTN, mod [name], admitted from [facility] 12/13 w/ chest pain, afib/RVR.  SUBJECTIVE: No more chest pain, never had palpitations.  OBJECTIVE Filed Vitals:   07/18/14 1654 07/18/14 2100 07/19/14 0012 07/19/14 0500  BP: 138/92 132/72 125/65 155/76  Pulse: 115 68 97 67  Temp: 98.1 F (36.7 C) 98.2 F (36.8 C)  98.4 F (36.9 C)  TempSrc: Oral     Resp: 18 21  18   Height: 5\' 4"  (1.626 m)     Weight: 229 lb 4.8 oz (104.01 kg)   226 lb (102.513 kg)  SpO2: 96% 97%  95%    Intake/Output Summary (Last 24 hours) at 07/19/14 0731 Last data filed at 07/19/14 0600  Gross per 24 hour  Intake 960.56 ml  Output    400 ml  Net 560.56 ml   Filed Weights   07/18/14 1654 07/19/14 0500  Weight: 229 lb 4.8 oz (104.01 kg) 226 lb (102.513 kg)    PHYSICAL EXAM General: Well developed, well nourished, female in no acute distress. Head: Normocephalic, atraumatic.  Neck: Supple without bruits, JVD not elevated Lungs:  Resp regular and unlabored, CTA. Heart: Irreg Irreg,  S1, S2, no S3, S4, 2/6 murmur; no rub. Abdomen: Soft, non-tender, non-distended, BS + x 4.  Extremities: No clubbing, cyanosis, no edema.  Neuro: Alert and oriented X 3. Moves all extremities spontaneously. Psych: Normal affect.  LABS: Cardiac Enzymes:  Recent Labs  07/18/14 2046 07/19/14 0213  TROPONINI 0.38* <0.30   Fasting Lipid Panel:  Recent Labs  07/19/14 0213  CHOL 146  HDL 32*  LDLCALC 67  TRIG 234*  CHOLHDL 4.6   Thyroid Function Tests:  Recent Labs  07/19/14 0213  TSH 2.660   TELE:   Atrial fib, rate improved     Current Medications:  . amLODipine  10 mg Oral Daily  .  aspirin EC  81 mg Oral Daily  . lisinopril  20 mg Oral Daily   And  . hydrochlorothiazide  12.5 mg Oral Daily  . metoprolol tartrate  50 mg Oral BID  . simvastatin  40 mg Oral QPM  . sodium chloride  3 mL Intravenous Q12H  . sodium chloride  3 mL Intravenous Q12H   . [START ON 07/20/2014] sodium chloride 1 mL/kg/hr (07/19/14 0405)  . diltiazem (CARDIZEM) infusion 5 mg/hr (07/18/14 2130)  . heparin 1,300 Units/hr (07/18/14 2052)    ASSESSMENT AND PLAN: Principal Problem:   Chest pain, moderate coronary artery risk - minimal troponin elevation initially, in the setting of rapid atrial fib. Ez have improved, no more symptoms once HR improved. Hx mod [name] in past, cath today to assess anatomy, will be this pm.  Active Problems:   Essential hypertension, benign - SBP 130s-150s, but is on 2 CCBs. Increase metoprolol to 75 mg bid, d/c IV Diltiazem, continue amlodipine, lisinopril/HCTZ    Persistent atrial fibrillation - rate improved on Cardizem, but needs amlodipine for BP control. Increase BB and d/c Cardizem    Hypertriglyceridemia - pt says has had elevated CBG in past, check BMET today    Anticoagulation - CHADS2VASC is 3, would look into NOAC cost for her.   Plan - d/c once medically  stable.   Jonetta Speak , PA-C 7:31 AM 07/19/2014

## 2014-07-20 ENCOUNTER — Encounter (HOSPITAL_COMMUNITY): Payer: Self-pay | Admitting: Cardiovascular Disease

## 2014-07-20 DIAGNOSIS — I248 Other forms of acute ischemic heart disease: Secondary | ICD-10-CM

## 2014-07-20 DIAGNOSIS — I10 Essential (primary) hypertension: Secondary | ICD-10-CM

## 2014-07-20 DIAGNOSIS — R079 Chest pain, unspecified: Secondary | ICD-10-CM

## 2014-07-20 MED ORDER — ATORVASTATIN CALCIUM 20 MG PO TABS: 20.0000 mg | ORAL_TABLET | Freq: Every day | ORAL | Status: DC

## 2014-07-20 MED ORDER — FUROSEMIDE 20 MG PO TABS: 20.0000 mg | ORAL_TABLET | Freq: Every day | ORAL | Status: DC | PRN

## 2014-07-20 MED ORDER — DILTIAZEM HCL ER 90 MG PO CP12: 90.0000 mg | ORAL_CAPSULE | Freq: Two times a day (BID) | ORAL | Status: DC

## 2014-07-20 MED ORDER — LISINOPRIL-HYDROCHLOROTHIAZIDE 10-12.5 MG PO TABS: 1.0000 | ORAL_TABLET | Freq: Every day | ORAL | Status: DC

## 2014-07-20 MED ORDER — METOPROLOL TARTRATE 25 MG PO TABS: 75.0000 mg | ORAL_TABLET | Freq: Two times a day (BID) | ORAL | Status: DC

## 2014-07-20 MED ORDER — APIXABAN 5 MG PO TABS: 5.0000 mg | ORAL_TABLET | Freq: Two times a day (BID) | ORAL | Status: DC

## 2014-07-20 MED ORDER — ASPIRIN 81 MG PO TABS: 81.0000 mg | ORAL_TABLET | Freq: Every day | ORAL | Status: DC

## 2014-07-20 NOTE — Discharge Summary (Signed)
Discharge instructions reviewed with patient and husband. Questions answered. Patient aware that prescriptions will be available for pick-up at [facility] in [place]. Patient also aware of scheduled procedure on Thursday and pre-op instructions. AVS given to patient. IV and telemetry discontinued. Tech to take patient via wheelchair to entrance where husband is waiting to pick her up.

## 2014-07-20 NOTE — Discharge Summary (Signed)
CARDIOLOGY DISCHARGE SUMMARY   Patient ID: [name] MRN: [id number] DOB/AGE: Nov 08, 1945 68 y.o.  Admit date: 07/18/2014 Discharge date: 07/20/2014  PCP: Gar Ponto, MD Primary Cardiologist: Dr. Bronson Ing  Primary Discharge Diagnosis: Demand ischemia of myocardium Secondary Discharge Diagnosis:    Essential hypertension, benign   Persistent atrial fibrillation- rapid ventricular response on admission   Chest pain, moderate coronary artery risk   Hypertriglyceridemia   Elevated troponin  Procedures: Cardiac catheterization, coronary arteriogram, 2-D echocardiogram  Hospital Course: [name] is a 68 y.o. female with a history of nonobstructive coronary artery disease by remote cath.  She went to [facility] with symptoms of chest heaviness and discomfort. She called EMS and was noted to have atrial fibrillation with rapid ventricular response. She went to [facility] where her troponin was elevated. She was transferred to [facility] for further evaluation and treatment.  Her initial troponin was slightly elevated but subsequent troponins were normal. She was rate controlled with IV Cardizem. Her symptoms were concerning and she has a history of moderate [name], so she was take to the Cath Lab and 12/14.  Cardiac catheterization results are below she had no obstructive disease and her EF was preserved by echocardiogram, results of this are also below. Her left atrium is moderately dilated at 46 mm.  After the cardiac catheterization, she was started on Eliquis for anticoagulation. Her rate was stable so the Cardizem was changed to oral medication which was up-titrated as her blood pressure and heart rate would allow. She had no significant bradycardia. She was also on a beta blocker and tolerated the combination well. Her blood pressure was borderline, so her lisinopril was decreased. Because of a potential interaction between Zocor and Cardizem, she  was changed to Lipitor at an equivalent dose.   On 12/15, she was seen by Dr. Ellyn Hack and all data were reviewed. He adjusted her medications for better heart rate control and discussed TEE/cardioversion with her and her husband. The risks and benefits of transesophageal echocardiogram have been explained including risks of esophageal damage, perforation (1:10,000 risk), bleeding, pharyngeal hematoma as well as other potential complications associated with conscious sedation including aspiration, arrhythmia, respiratory failure and death. Alternatives to treatment were discussed, questions were answered. Patient is willing to proceed.   Due to scheduling conflicts, the TEE/DCCV could not be immediately arranged. Therefore, arrangements were made to pursue this as an outpatient. She is scheduled for 2 PM on Thursday, 12/17. No further inpatient workup was indicated and she is considered stable for discharge, to follow up as scheduled.   Labs:   Lab Results  Component Value Date   WBC 5.9 07/19/2014   HGB 12.8 07/19/2014   HCT 39.1 07/19/2014   MCV 83.0 07/19/2014   PLT 194 07/19/2014     Recent Labs Lab 07/19/14 0800  NA 140  K 4.5  CL 105  CO2 22  BUN 8  CREATININE 0.49*  CALCIUM 8.9  GLUCOSE 118*    Recent Labs  07/18/14 2046 07/19/14 0213 07/19/14 0800  TROPONINI 0.38* <0.30 <0.30   Lipid Panel     Component Value Date/Time   CHOL 146 07/19/2014 0213   TRIG 234* 07/19/2014 0213   HDL 32* 07/19/2014 0213   CHOLHDL 4.6 07/19/2014 0213   VLDL 47* 07/19/2014 0213   LDLCALC 67 07/19/2014 0213   Cardiac Cath: 07/19/2014 Coronary angiography: Coronary dominance: right Left mainstem: Widely patent, no obstructive disease Left anterior descending (LAD): Patent throughout.  Medium caliber vessel, extends to the distal anterior wall. There are 2 diagonal branches, both small in caliber, without significant stenosis. There are minor diffuse irregularities noted.  Left  circumflex (LCx): Large caliber vessel, 2 OM branches are widely patent without obstruction. Right coronary artery (RCA): Diffuse nonobstructive disease and mild diffuse calcification noted. The PDA and PLA branches are small but patent. The mid-RCA has 30% stenosis. No high-grade obstructive disease noted.  Left ventriculography: deferred. LVEF normal by echo with severe LVH.  Estimated Blood Loss: minimal Final Conclusions:  1. Mild diffuse nonobstructive [name] 2. Normal LV function by echo Recommendations: add diltiazem for better rate-control of AF. Add Eliquis for anticoagulation. May need DCCV.  EKG: Atrial fibrillation, no acute ischemic changes  Echo: 07/19/2014 Conclusions - Left ventricle: The cavity size was normal. Wall thickness was increased in a pattern of severe LVH. Systolic function was normal. The estimated ejection fraction was in the range of 55% to 60%. - Mitral valve: Calcified annulus. Mildly thickened leaflets . - Left atrium: The atrium was moderately dilated. - Atrial septum: No defect or patent foramen ovale was identified.  FOLLOW UP PLANS AND APPOINTMENTS Allergies  Allergen Reactions  . Codeine     REACTION: hallucinate     Medication List    STOP taking these medications        amLODipine 10 MG tablet  Commonly known as:  NORVASC     lisinopril-hydrochlorothiazide 20-12.5 MG per tablet  Commonly known as:  PRINZIDE,ZESTORETIC  Replaced by:  lisinopril-hydrochlorothiazide 10-12.5 MG per tablet     simvastatin 40 MG tablet  Commonly known as:  ZOCOR      TAKE these medications        apixaban 5 MG Tabs tablet  Commonly known as:  ELIQUIS  Take 1 tablet (5 mg total) by mouth 2 (two) times daily.     aspirin 81 MG tablet  Take 1 tablet (81 mg total) by mouth daily.     atorvastatin 20 MG tablet  Commonly known as:  LIPITOR  Take 1 tablet (20 mg total) by mouth daily at 6 PM.     Calcium + D3 600-200 MG-UNIT Tabs  Take 1 tablet  by mouth daily.     cholecalciferol 1000 UNITS tablet  Commonly known as:  VITAMIN D  Take 2,000 Units by mouth daily.     Coenzyme Q10 50 MG Caps  Take 1 capsule by mouth daily.     diltiazem 90 MG 12 hr capsule  Commonly known as:  CARDIZEM SR  Take 1 capsule (90 mg total) by mouth 2 (two) times daily.     Flaxseed Oil 1000 MG Caps  Take 1 capsule by mouth 2 (two) times daily.     furosemide 20 MG tablet  Commonly known as:  LASIX  Take 1 tablet (20 mg total) by mouth daily as needed for edema.     lisinopril-hydrochlorothiazide 10-12.5 MG per tablet  Commonly known as:  ZESTORETIC  Take 1 tablet by mouth daily.     metoprolol tartrate 25 MG tablet  Commonly known as:  LOPRESSOR  Take 3 tablets (75 mg total) by mouth 2 (two) times daily.        Discharge Instructions    Diet - low sodium heart healthy    Complete by:  As directed      Increase activity slowly    Complete by:  As directed           Follow-up  Information    Follow up with [facility] On 07/22/2014.   Why:  No food/drink after midnight before. Take a.m. medications with a sip of water. Arrive at noon at admissions for a 2 PM procedure.   Contact information:   [place] [address] [phone]      Follow up with Herminio Commons, MD.   Specialty:  Cardiology   Why:  The office will call.   Contact information:   [place] [address] [phone]       BRING ALL MEDICATIONS WITH YOU TO FOLLOW UP APPOINTMENTS  Time spent with patient to include physician time: 48 min Signed: Rosaria Ferries, PA-C 07/20/2014, 5:18 PM Co-Sign MD

## 2014-07-20 NOTE — Care Management Note (Signed)
    Page 1 of 1   07/20/2014     12:25:36 PM CARE MANAGEMENT NOTE 07/20/2014  Patient:  [name]   Account Number:  [id number]  Date Initiated:  07/20/2014  Documentation initiated by:  GRAVES-BIGELOW,Deeana [place]  Subjective/Objective Assessment:   Pt admitted for Afib, + troponin, CP. Plan for d/c today.     Action/Plan:   CM did provide pt with the 30 day free eliquis card. [facility] in [place] has medication available. Benefits check in process   Anticipated DC Date:  07/20/2014   Anticipated DC Plan:  [place]  CM consult      Choice offered to / List presented to:             Status of service:  Completed, signed off Medicare Important Message given?  NA - LOS <3 / Initial given by admissions (If response is "NO", the following Medicare IM given date fields will be blank) Date Medicare IM given:   Medicare IM given by:   Date Additional Medicare IM given:   Additional Medicare IM given by:    Discharge Disposition:  HOME/SELF CARE  Per UR Regulation:  Reviewed for med. necessity/level of care/duration of stay  If discussed at [place] of Stay Meetings, dates discussed:    Comments:

## 2014-07-20 NOTE — Plan of Care (Signed)
Problem: Phase II Progression Outcomes Goal: Pain controlled with appropriate interventions Outcome: Not Applicable Date Met:  42/62/70 Patient denies pain

## 2014-07-20 NOTE — Progress Notes (Signed)
Patient Name: [name] Date of Encounter: 07/20/2014  Principal Problem:   Demand ischemia of myocardium Active Problems:   Essential hypertension, benign   Persistent atrial fibrillation   Chest pain, moderate coronary artery risk   Hypertriglyceridemia   Elevated troponin   Primary Cardiologist: Bronson Ing  Patient Profile: 68 yo female w/ hx mod LA, obesity, HTN, mod [name], admitted from [facility] 12/13 w/ chest pain, afib/RVR. Cath w/ non-obs dz, EF nl.  SUBJECTIVE: No chest pain, no SOB, no awareness of afib.  OBJECTIVE Filed Vitals:   07/19/14 1935 07/19/14 2100 07/20/14 0500 07/20/14 1033  BP: 90/47 108/56 117/61 106/49  Pulse: 31 98 63   Temp:  98.4 F (36.9 C) 98.3 F (36.8 C)   TempSrc:      Resp: 22 24 22    Height:      Weight:      SpO2: 89% 94% 92%     Intake/Output Summary (Last 24 hours) at 07/20/14 1111 Last data filed at 07/20/14 1036  Gross per 24 hour  Intake    486 ml  Output   2100 ml  Net  -1614 ml   Filed Weights   07/18/14 1654 07/19/14 0500  Weight: 229 lb 4.8 oz (104.01 kg) 226 lb (102.513 kg)    PHYSICAL EXAM General: Well developed, well nourished, female in no acute distress. Head: Normocephalic, atraumatic.  Neck: Supple without bruits, JVD not elevated. Lungs:  Resp regular and unlabored, CTA. Heart: Irreg irreg, S1, S2, no S3, S4, 2/6 murmur; no rub. Abdomen: Soft, non-tender, non-distended, BS + x 4.  Extremities: No clubbing, cyanosis, no edema.  Neuro: Alert and oriented X 3. Moves all extremities spontaneously. Psych: Normal affect.  LABS: CBC:  Recent Labs  07/19/14 0800  WBC 5.9  HGB 12.8  HCT 39.1  MCV 83.0  PLT 742   Basic Metabolic Panel:  Recent Labs  07/19/14 0800  NA 140  K 4.5  CL 105  CO2 22  GLUCOSE 118*  BUN 8  CREATININE 0.49*  CALCIUM 8.9   Cardiac Enzymes:  Recent Labs  07/18/14 2046 07/19/14 0213 07/19/14 0800  TROPONINI 0.38* <0.30 <0.30   Fasting Lipid  Panel:  Recent Labs  07/19/14 0213  CHOL 146  HDL 32*  LDLCALC 67  TRIG 234*  CHOLHDL 4.6   Thyroid Function Tests:  Recent Labs  07/19/14 0213  TSH 2.660   TELE:  Atrial fib, rate generally OK.  Current Medications:  . apixaban  5 mg Oral BID  . aspirin EC  81 mg Oral Daily  . atorvastatin  20 mg Oral q1800  . diltiazem  60 mg Oral 4 times per day  . lisinopril  20 mg Oral Daily   And  . hydrochlorothiazide  12.5 mg Oral Daily  . metoprolol tartrate  75 mg Oral BID  . sodium chloride  3 mL Intravenous Q12H  . sodium chloride  3 mL Intravenous Q12H      ASSESSMENT AND PLAN: Principal Problem:   Demand ischemia of myocardium - cath w/ non-obs dz, felt 2nd rapid atrial fib, EF preserved  Active Problems:   Essential hypertension, benign - BP much better on Cardizem, follow    Persistent atrial fibrillation, rapid ventricular response on admission - rate OK when at rest on Cardizem, BP too low for dose increase. Pt would likely benefit from SR, LA 46 mm on echo. Duration unclear so will need TEE before DCCV. Unable to arrange today  or tomorrow. MD advise if could be done as OP or keep till 12/17.  [facility] has been requested to perform a transesophageal echocardiogram on 12/17 for rapid atrial fib.  After careful review of history and examination, the risks and benefits of transesophageal echocardiogram have been explained including risks of esophageal damage, perforation (1:10,000 risk), bleeding, pharyngeal hematoma as well as other potential complications associated with conscious sedation including aspiration, arrhythmia, respiratory failure and death. Alternatives to treatment were discussed, questions were answered. Patient is willing to proceed.     Chest pain, moderate coronary artery risk - encourage CRF control.    Hypertriglyceridemia - dietary changes and f/u with primary MD    Elevated troponin - See above.  Plan - d/c when medically stable. Tx  telemetry  Signed, Rosaria Ferries , PA-C 11:11 AM 07/20/2014  I have seen and evaluated the patient this AM. After reviewing tele & examining the patient,  I agree with Rhonda's findings, examination as well as impression recommendations.   Normal coronaries on cath - rate sub-optimally controlled, but stable -- would like to see her BP & HR after next (1200 Noon) dose of Diltiazem. Started on Elliquis last PM. -- unable to schedule TEE/DCCV today or even tomorrow.  If BP is stable & HR relatively well rate controlled this PM - I think think that she can likely be discharged home on 90 mg BID Diltiazaem, 75 mg BID Metoprolol & reduced ACE-I dose to 20 mg. -- rate has not been easy to control & requiring increasing doses of AV nodal agents - would need TEE for cardioversion as heduration of Afib was unknown.  I do think that DCCV with TEE early is preferred to 4 wks AC followed by DCCV - mostly b/c difficulty with true rate control & Sx with tachycardia (with nighttime rates in 60s - unwilling to titrate AVN agents too much).  Plan for OP TEE/DCCV Thurs or Friday.  Will ROV with DR. Koneswaran @ [place] office after DCCV. May consider Antiarrhythmic agent following DCCV.   MD Time with pt: 25 min  HARDING,DAVID W, M.D., M.S. Interventional Cardiologist   Pager # [phone]

## 2014-07-20 NOTE — Discharge Instructions (Addendum)
Information on my medicine - ELIQUIS® (apixaban) ° °This medication education was reviewed with me or my healthcare representative as part of my discharge preparation.  The pharmacist that spoke with me during my hospital stay was:  Wilson, Frank Rhea, RPH ° °Why was Eliquis® prescribed for you? °Eliquis® was prescribed for you to reduce the risk of a blood clot forming that can cause a stroke if you have a medical condition called atrial fibrillation (a type of irregular heartbeat). ° °What do You need to know about Eliquis® ? °Take your Eliquis® TWICE DAILY - one tablet in the morning and one tablet in the evening with or without food. If you have difficulty swallowing the tablet whole please discuss with your pharmacist how to take the medication safely. ° °Take Eliquis® exactly as prescribed by your doctor and DO NOT stop taking Eliquis® without talking to the doctor who prescribed the medication.  Stopping may increase your risk of developing a stroke.  Refill your prescription before you run out. ° °After discharge, you should have regular check-up appointments with your healthcare provider that is prescribing your Eliquis®.  In the future your dose may need to be changed if your kidney function or weight changes by a significant amount or as you get older. ° °What do you do if you miss a dose? °If you miss a dose, take it as soon as you remember on the same day and resume taking twice daily.  Do not take more than one dose of ELIQUIS at the same time to make up a missed dose. ° °Important Safety Information °A possible side effect of Eliquis® is bleeding. You should call your healthcare provider right away if you experience any of the following: °  Bleeding from an injury or your nose that does not stop. °  Unusual colored urine (red or dark brown) or unusual colored stools (red or black). °  Unusual bruising for unknown reasons. °  A serious fall or if you hit your head (even if there is no  bleeding). ° °Some medicines may interact with Eliquis® and might increase your risk of bleeding or clotting while on Eliquis®. To help avoid this, consult your healthcare provider or pharmacist prior to using any new prescription or non-prescription medications, including herbals, vitamins, non-steroidal anti-inflammatory drugs (NSAIDs) and supplements. ° °This website has more information on Eliquis® (apixaban): http://www.eliquis.com/eliquis/home ° °

## 2014-07-21 ENCOUNTER — Telehealth: Payer: Self-pay | Admitting: Internal Medicine

## 2014-07-21 NOTE — Telephone Encounter (Signed)
Explained procedure to patient.  Advised to continue her Eliquis twice a day as currently taking prior to this procedure.

## 2014-07-21 NOTE — Telephone Encounter (Signed)
F/U    Pt calling back wants to know if she should take Blood thinners before she has procedure completed in the morning.     Is pt having procedure completed tomorrow? If so can she have it scheduled for early morning?  Please call pt

## 2014-07-21 NOTE — Telephone Encounter (Signed)
New msg    Pt states that she is scheduled to have a procedure completed tomorrow with Dr. Rayann Heman.   Pt has just been released from hospital today and wants more information on the procedure.  Please contact at home [phone]

## 2014-07-21 NOTE — Telephone Encounter (Signed)
Returned call to patient.  Stated that she wanted to try to change the date of her procedure.  But, kept getting transferred to different locations.  Offered to try to reschedule this tomorrow morning after discussing with manager as I did not schedule initial procedure.  This was done at hospital d/c.  Patient stated that she would just go ahead and keep time tomorrow to get this over with.  Apologized for any inconvenience & told her if she changed her mind to call first thing in the morning and we will work this out for her.  Patient verbalized understanding.

## 2014-07-21 NOTE — Telephone Encounter (Signed)
Per discharge summary 07/20/14 - (Rhonda Barrett, PA) - Due to scheduling conflicts, the TEE/DCCV could not be immediately arranged. Therefore, arrangements were made to pursue this as an outpatient. She is scheduled for 2 PM on Thursday, 12/17. No further inpatient workup was indicated and she is considered stable for discharge, to follow up as scheduled.

## 2014-07-21 NOTE — Telephone Encounter (Signed)
New Prob    Pt has some questions regarding her procedure tomorrow. Please call.

## 2014-07-22 ENCOUNTER — Ambulatory Visit (HOSPITAL_COMMUNITY)
Admission: RE | Admit: 2014-07-22 | Discharge: 2014-07-22 | Disposition: A | Discharge status: Home / Self Care | Payer: Medicare Other | Source: Other Acute Inpatient Hospital | Attending: Cardiology | Admitting: Cardiology

## 2014-07-22 ENCOUNTER — Encounter (HOSPITAL_COMMUNITY): Payer: Self-pay | Admitting: *Deleted

## 2014-07-22 ENCOUNTER — Encounter (HOSPITAL_COMMUNITY)
Admission: RE | Disposition: A | Discharge status: Home / Self Care | Payer: Self-pay | Source: Other Acute Inpatient Hospital | Attending: Cardiology

## 2014-07-22 ENCOUNTER — Ambulatory Visit (HOSPITAL_COMMUNITY): Payer: Medicare Other | Admitting: Certified Registered Nurse Anesthetist

## 2014-07-22 DIAGNOSIS — Z6839 Body mass index (BMI) 39.0-39.9, adult: Secondary | ICD-10-CM | POA: Insufficient documentation

## 2014-07-22 DIAGNOSIS — I080 Rheumatic disorders of both mitral and aortic valves: Secondary | ICD-10-CM | POA: Diagnosis not present

## 2014-07-22 DIAGNOSIS — I340 Nonrheumatic mitral (valve) insufficiency: Secondary | ICD-10-CM

## 2014-07-22 DIAGNOSIS — I4819 Other persistent atrial fibrillation: Secondary | ICD-10-CM | POA: Diagnosis present

## 2014-07-22 DIAGNOSIS — I2510 Atherosclerotic heart disease of native coronary artery without angina pectoris: Secondary | ICD-10-CM | POA: Diagnosis not present

## 2014-07-22 DIAGNOSIS — I248 Other forms of acute ischemic heart disease: Secondary | ICD-10-CM | POA: Diagnosis not present

## 2014-07-22 DIAGNOSIS — R079 Chest pain, unspecified: Secondary | ICD-10-CM | POA: Insufficient documentation

## 2014-07-22 DIAGNOSIS — R7989 Other specified abnormal findings of blood chemistry: Secondary | ICD-10-CM | POA: Diagnosis not present

## 2014-07-22 DIAGNOSIS — I481 Persistent atrial fibrillation: Secondary | ICD-10-CM

## 2014-07-22 DIAGNOSIS — Z8249 Family history of ischemic heart disease and other diseases of the circulatory system: Secondary | ICD-10-CM | POA: Diagnosis not present

## 2014-07-22 DIAGNOSIS — Z885 Allergy status to narcotic agent status: Secondary | ICD-10-CM | POA: Diagnosis not present

## 2014-07-22 DIAGNOSIS — Z8673 Personal history of transient ischemic attack (TIA), and cerebral infarction without residual deficits: Secondary | ICD-10-CM | POA: Insufficient documentation

## 2014-07-22 DIAGNOSIS — I10 Essential (primary) hypertension: Secondary | ICD-10-CM | POA: Diagnosis not present

## 2014-07-22 DIAGNOSIS — E669 Obesity, unspecified: Secondary | ICD-10-CM | POA: Diagnosis not present

## 2014-07-22 DIAGNOSIS — I4891 Unspecified atrial fibrillation: Secondary | ICD-10-CM | POA: Diagnosis not present

## 2014-07-22 DIAGNOSIS — I429 Cardiomyopathy, unspecified: Secondary | ICD-10-CM | POA: Insufficient documentation

## 2014-07-22 DIAGNOSIS — I214 Non-ST elevation (NSTEMI) myocardial infarction: Secondary | ICD-10-CM | POA: Insufficient documentation

## 2014-07-22 HISTORY — PX: CARDIOVERSION: SHX1299

## 2014-07-22 HISTORY — PX: TEE WITHOUT CARDIOVERSION: SHX5443

## 2014-07-22 HISTORY — DX: Cardiac arrhythmia, unspecified: I49.9

## 2014-07-22 SURGERY — ECHOCARDIOGRAM, TRANSESOPHAGEAL
Anesthesia: Monitor Anesthesia Care

## 2014-07-22 MED ORDER — SODIUM CHLORIDE 0.9 % IV SOLN
INTRAVENOUS | Status: DC | PRN
  Administered 2014-07-22: 14:00:00 via INTRAVENOUS

## 2014-07-22 MED ORDER — MIDAZOLAM HCL 5 MG/5ML IJ SOLN
INTRAMUSCULAR | Status: DC | PRN
  Administered 2014-07-22: 2 mg via INTRAVENOUS

## 2014-07-22 MED ORDER — LIDOCAINE VISCOUS 2 % MT SOLN
OROMUCOSAL | Status: DC | PRN
  Administered 2014-07-22: 12 mL via OROMUCOSAL

## 2014-07-22 MED ORDER — LIDOCAINE VISCOUS 2 % MT SOLN
OROMUCOSAL | Status: AC
  Filled 2014-07-22: qty 15

## 2014-07-22 MED ORDER — SODIUM CHLORIDE 0.9 % IV SOLN: INTRAVENOUS | Status: DC

## 2014-07-22 MED ORDER — PROPOFOL INFUSION 10 MG/ML OPTIME
INTRAVENOUS | Status: DC | PRN
  Administered 2014-07-22: 50 ug/kg/min via INTRAVENOUS

## 2014-07-22 MED ORDER — FENTANYL CITRATE 0.05 MG/ML IJ SOLN
INTRAMUSCULAR | Status: DC | PRN
  Administered 2014-07-22: 50 ug via INTRAVENOUS

## 2014-07-22 MED ORDER — BUTAMBEN-TETRACAINE-BENZOCAINE 2-2-14 % EX AERO
INHALATION_SPRAY | CUTANEOUS | Status: DC | PRN
  Administered 2014-07-22: 2 via TOPICAL

## 2014-07-22 NOTE — Progress Notes (Signed)
*  PRELIMINARY RESULTS* Echocardiogram TEE has been performed.  Leavy Cella 07/22/2014, 2:42 PM

## 2014-07-22 NOTE — CV Procedure (Signed)
   Electrical Cardioversion Procedure Note [name] [id number] 20-Mar-1946  Procedure: Electrical Cardioversion Indications:  Atrial Fibrillation  Time Out: Verified patient identification, verified procedure,medications/allergies/relevent history reviewed, required imaging and test results available.  Performed  Procedure Details  The patient was NPO after midnight. Anesthesia was administered at the beside  by Dr.William Ola Spurr with 30mg  of propofol.  Cardioversion was done with synchronized biphasic defibrillation with AP pads with 150watts.  The patient converted to normal sinus rhythm. The patient tolerated the procedure well   IMPRESSION:  Successful cardioversion of atrial fibrillation    [name],[name] 07/22/2014, 2:25 PM

## 2014-07-22 NOTE — Anesthesia Preprocedure Evaluation (Addendum)
Anesthesia Evaluation  Patient identified by MRN, date of birth, ID band Patient awake    Reviewed: Allergy & Precautions, H&P , NPO status , Patient's Chart, lab work & pertinent test results  Airway Mallampati: II  TM Distance: >3 FB Neck ROM: Full    Dental no notable dental hx. (+) Teeth Intact, Dental Advisory Given   Pulmonary neg pulmonary ROS,  breath sounds clear to auscultation  Pulmonary exam normal       Cardiovascular hypertension, Pt. on medications + [name] + dysrhythmias Atrial Fibrillation Rhythm:Irregular Rate:Normal     Neuro/Psych negative neurological ROS  negative psych ROS   GI/Hepatic negative GI ROS, Neg liver ROS,   Endo/Other  Morbid obesity  Renal/GU negative Renal ROS  negative genitourinary   Musculoskeletal   Abdominal   Peds  Hematology negative hematology ROS (+)   Anesthesia Other Findings   Reproductive/Obstetrics negative OB ROS                            Anesthesia Physical Anesthesia Plan  ASA: III  Anesthesia Plan: MAC   Post-op Pain Management:    Induction: Intravenous  Airway Management Planned: Nasal Cannula  Additional Equipment:   Intra-op Plan:   Post-operative Plan:   Informed Consent: I have reviewed the patients History and Physical, chart, labs and discussed the procedure including the risks, benefits and alternatives for the proposed anesthesia with the patient or authorized representative who has indicated his/her understanding and acceptance.   Dental advisory given  Plan Discussed with: Anesthesiologist, Surgeon and CRNA  Anesthesia Plan Comments:        Anesthesia Quick Evaluation

## 2014-07-22 NOTE — Anesthesia Postprocedure Evaluation (Signed)
  Anesthesia Post-op Note  Patient: [name]  Procedure(s) Performed: Procedure(s): TRANSESOPHAGEAL ECHOCARDIOGRAM (TEE) (N/A) CARDIOVERSION (N/A)  Patient Location: Endoscopy Unit  Anesthesia Type:MAC  Level of Consciousness: awake, alert  and oriented  Airway and Oxygen Therapy: Patient Spontanous Breathing  Post-op Pain: none  Post-op Assessment: Post-op Vital signs reviewed, Patient's Cardiovascular Status Stable and Respiratory Function Stable  Post-op Vital Signs: Reviewed and stable  Last Vitals:  Filed Vitals:   07/22/14 1437  BP: 121/45  Pulse: 53  Temp:   Resp: 24    Complications: No apparent anesthesia complications

## 2014-07-22 NOTE — Interval H&P Note (Signed)
History and Physical Interval Note:  07/22/2014 1:31 PM  [name]  has presented today for surgery, with the diagnosis of afib  The various methods of treatment have been discussed with the patient and family. After consideration of risks, benefits and other options for treatment, the patient has consented to  Procedure(s): TRANSESOPHAGEAL ECHOCARDIOGRAM (TEE) (N/A) CARDIOVERSION (N/A) as a surgical intervention .  The patient's history has been reviewed, patient examined, no change in status, stable for surgery.  I have reviewed the patient's chart and labs.  Questions were answered to the patient's satisfaction.     [name],[name]

## 2014-07-22 NOTE — H&P (View-Only) (Signed)
CARDIOLOGY ADMIT NOTE     Primary Care Physician: Gar Ponto, MD  Primary Cardiologist:  Dr Bronson Ing  Referring Physician:  Dr Marya Amsler Date: 07/18/2014  Reason for consultation:  Afib, + troponin, CP  [name] is a 68 y.o. female with a h/o moderate LA enlargement, obesity and HTN who presents on transfer from [facility] for consideration of cath.  She reports developing symptoms of chest heaviness/ discomfort which she describes as moderate in intensity yesterday.  She called EMS and was noted to have Afib with RVR.  She presented to [facility] where she was noted to have ST depression and a troponin of 0.8  She is transferred to [facility] for further management.  She continues to have mild chest heaviness.  She underwent cath 2008 which revealed mild [name] at that time.  She has moderate LVH on prior echo which is felt to be due to hypertensive cardiovascular disease.  Today, she denies symptoms of palpitations, shortness of breath, orthopnea, PND, lower extremity edema, dizziness, presyncope, syncope, or neurologic sequela.  She does snore and her spouse suspects sleep apnea. The patient is tolerating medications without difficulties and is otherwise without complaint today.   Past Medical History  Diagnosis Date  . Obesity   . Hypertensive cardiovascular disease   . Left atrial dilatation 2013  . [name] (coronary artery disease) 2008    mild   Past Surgical History  Procedure Laterality Date  . Abdominal hysterectomy      Home meds and allergies reviewed   Allergies  Allergen Reactions  . Codeine     REACTION: hallucinate    History   Social History  . Marital Status: Married    Spouse Name: N/A    Number of Children: N/A  . Years of Education: N/A   Occupational History  . Retired    Social History Main Topics  . Smoking status: Never Smoker   . Smokeless tobacco: Never Used  . Alcohol Use: No  . Drug Use: No  . Sexual  Activity: Not on file   Other Topics Concern  . Not on file   Social History Narrative   Lives in [place]   Retired    Family History  Problem Relation Age of Onset  . Hypertension      ROS- All systems are reviewed and negative except as per the HPI above  Physical Exam: Telemetry: Filed Vitals:   07/18/14 1654  BP: 138/92  Pulse: 115  Temp: 98.1 F (36.7 C)  TempSrc: Oral  Resp: 18  Height: 5\' 4"  (1.626 m)  Weight: 229 lb 4.8 oz (104.01 kg)  SpO2: 96%    GEN- The patient is overweight appearing, alert and oriented x 3 today.   Head- normocephalic, atraumatic Eyes-  Sclera clear, conjunctiva pink Ears- hearing intact Oropharynx- clear Neck- supple, no JVP Lymph- no cervical lymphadenopathy Lungs- Clear to ausculation bilaterally, normal work of breathing Heart- irregular rate and rhythm, no murmurs, rubs or gallops, PMI not laterally displaced GI- soft, NT, ND, + BS Extremities- no clubbing, cyanosis, or edema MS- no significant deformity or atrophy Skin- no rash or lesion Psych- euthymic mood, full affect Neuro- strength and sensation are intact  EKG from [facility] is not available.  Rhythm strip reveals afib with RVR  Labs: [facility] labs are reviewed Notable is troponin 0.8   ASSESSMENT AND PLAN:   1. NSTEMI She has troponin elevation in the setting of AF with RVR.  I am  concerned that this may represent progression of prior [name] on cath 2008.  I agree with Dr Quillian Quince that cath is indicated at this time.  Will place on ASA and cycle CMs overnight.  Check lipids and rate control AF with beta blockers. Discussed the cath with the patient. The patient understands that risks included but are not limited to stroke (1 in 1000), death (1 in 29), kidney failure [usually temporary] (1 in 500), bleeding (1 in 200), allergic reaction [possibly serious] (1 in 200). The patient understands and agrees to proceed.  Place on heparin drip.   2. AF The patient presents with  persistent AF.  She has symptoms of chest discomfort and elevated troponin likely due to elevated V rates.  Initiate metoprolol for rate control.  Her chads2vasc score is at least 3.  I will wait until after cath to start anticoagulation.   Heparin drip for now.  Post cath, would plan to transition to a NOAC. She had moderate LA enlargement and pulmonary hypertension oin 2013.  This is likely due to obesity, hypertension, and possibly sleep apnea.  Lifestyle modification is necessary long term.  I worry that her AF may be difficult to treat.  Check TFTs  3. Obesity Weight loss advised Body mass index is 39.34 kg/(m^2).  4. Snoring Will need outpatient sleep study  5. Hypertensive cardiovascular disease Moderate LVH on prior echo (2013) Repeat echo at this time Start metoprolol for rate control Resume home antihypertensives  6. HL Resume simvastatin Check fasting lipids   Thompson Grayer, MD 07/18/2014  7:49 PM

## 2014-07-22 NOTE — CV Procedure (Signed)
PROCEDURE NOTE  Procedure:  Transesophageal echocardiogram Operator:  Fransico Him, MD Indications: atrial fibrillation Complications:None IV Meds:Propofol 70mg  IV  Results: Normal LV size and function with severe LVF.  EF 55-60% Normal RV size and function Moderately dilated LA Normal LA appendage with no spontaneous echo contrast and no evidence of thrombus Normal RA Normal trileaflet AV with trivial AI Normal MV leaflets with possible trivial prolapse of the posterior mitral valve leaflet and mild to moderate MR Normal TV Normal PV Normal interatrial septum by coloflow doppler Normal ascending and thoracic aorta  The patient tolerated the procedure and went on to DCCV Signed: Fransico Him, MD [facility] 07/22/2014

## 2014-07-22 NOTE — Discharge Instructions (Signed)
Transesophageal Echocardiogram °Transesophageal echocardiography (TEE) is a special type of test that produces images of the heart by using sound waves (echocardiogram). This type of echocardiography can obtain better images of the heart than standard echocardiography. TEE is done by passing a flexible tube down the esophagus. The heart is located in front of the esophagus. Because the heart and esophagus are close to one another, your health care provider can take very clear, detailed pictures of the heart via ultrasound waves. °TEE may be done: °· If your health care provider needs more information based on standard echocardiography findings. °· If you had a stroke. This might have happened because a clot formed in your heart. TEE can visualize different areas of the heart and check for clots. °· To check valve anatomy and function. °· To check for infection on the inside of your heart (endocarditis). °· To evaluate the dividing wall (septum) of the heart and presence of a hole that did not close after birth (patent foramen ovale or atrial septal defect). °· To help diagnose a tear in the wall of the aorta (aortic dissection). °· During cardiac valve surgery. This allows the surgeon to assess the valve repair before closing the chest. °· During a variety of other cardiac procedures to guide positioning of catheters. °· Sometimes before a cardioversion, which is a shock to convert heart rhythm back to normal. °LET YOUR HEALTH CARE PROVIDER KNOW ABOUT:  °· Any allergies you have. °· All medicines you are taking, including vitamins, herbs, eye drops, creams, and over-the-counter medicines. °· Previous problems you or members of your family have had with the use of anesthetics. °· Any blood disorders you have. °· Previous surgeries you have had. °· Medical conditions you have. °· Swallowing difficulties. °· An esophageal obstruction. °RISKS AND COMPLICATIONS  °Generally, TEE is a safe procedure. However, as with any  procedure, complications can occur. Possible complications include an esophageal tear (rupture). °BEFORE THE PROCEDURE  °· Do not eat or drink for 6 hours before the procedure or as directed by your health care provider. °· Arrange for someone to drive you home after the procedure. Do not drive yourself home. During the procedure, you will be given medicines that can continue to make you feel drowsy and can impair your reflexes. °· An IV access tube will be started in the arm. °PROCEDURE  °· A medicine to help you relax (sedative) will be given through the IV access tube. °· A medicine may be sprayed or gargled to numb the back of the throat. °· Your blood pressure, heart rate, and breathing (vital signs) will be monitored during the procedure. °· The TEE probe is a long, flexible tube. The tip of the probe is placed into the back of the mouth, and you will be asked to swallow. This helps to pass the tip of the probe into the esophagus. Once the tip of the probe is in the correct area, your health care provider can take pictures of the heart. °· TEE is usually not a painful procedure. You may feel the probe press against the back of the throat. The probe does not enter the trachea and does not affect your breathing. °AFTER THE PROCEDURE  °· You will be in bed, resting, until you have fully returned to consciousness. °· When you first awaken, your throat may feel slightly sore and will probably still feel numb. This will improve slowly over time. °· You will not be allowed to eat or drink until it   is clear that the numbness has improved.  Once you have been able to drink, urinate, and sit on the edge of the bed without feeling sick to your stomach (nausea) or dizzy, you may be cleared to go home.  You should have a friend or family member with you for the next 24 hours after your procedure. Document Released: 10/13/2002 Document Revised: 07/28/2013 Document Reviewed: 01/22/2013 [facility] Patient Information  2015 [place], [place]. This information is not intended to replace advice given to you by your health care provider. Make sure you discuss any questions you have with your health care provider. Monitored Anesthesia Care Monitored anesthesia care is an anesthesia service for a medical procedure. Anesthesia is the loss of the ability to feel pain. It is produced by medicines called anesthetics. It may affect a small area of your body (local anesthesia), a large area of your body (regional anesthesia), or your entire body (general anesthesia). The need for monitored anesthesia care depends your procedure, your condition, and the potential need for regional or general anesthesia. It is often provided during procedures where:   General anesthesia may be needed if there are complications. This is because you need special care when you are under general anesthesia.   You will be under local or regional anesthesia. This is so that you are able to have higher levels of anesthesia if needed.   You will receive calming medicines (sedatives). This is especially the case if sedatives are given to put you in a semi-conscious state of relaxation (deep sedation). This is because the amount of sedative needed to produce this state can be hard to predict. Too much of a sedative can produce general anesthesia. Monitored anesthesia care is performed by one or more health care providers who have special training in all types of anesthesia. You will need to meet with these health care providers before your procedure. During this meeting, they will ask you about your medical history. They will also give you instructions to follow. (For example, you will need to stop eating and drinking before your procedure. You may also need to stop or change medicines you are taking.) During your procedure, your health care providers will stay with you. They will:   Watch your condition. This includes watching your blood pressure, breathing,  and level of pain.   Diagnose and treat problems that occur.   Give medicines if they are needed. These may include calming medicines (sedatives) and anesthetics.   Make sure you are comfortable.  Having monitored anesthesia care does not necessarily mean that you will be under anesthesia. It does mean that your health care providers will be able to manage anesthesia if you need it or if it occurs. It also means that you will be able to have a different type of anesthesia than you are having if you need it. When your procedure is complete, your health care providers will continue to watch your condition. They will make sure any medicines wear off before you are allowed to go home.  Document Released: 04/18/2005 Document Revised: 12/07/2013 Document Reviewed: 09/03/2012 [facility] Patient Information 2015 [place], [place]. This information is not intended to replace advice given to you by your health care provider. Make sure you discuss any questions you have with your health care provider.

## 2014-07-22 NOTE — Transfer of Care (Signed)
Immediate Anesthesia Transfer of Care Note  Patient: [name]  Procedure(s) Performed: Procedure(s): TRANSESOPHAGEAL ECHOCARDIOGRAM (TEE) (N/A) CARDIOVERSION (N/A)  Patient Location: Endoscopy Unit  Anesthesia Type:MAC  Level of Consciousness: awake, alert  and oriented  Airway & Oxygen Therapy: Patient Spontanous Breathing  Post-op Assessment: Report given to PACU RN  Post vital signs: Reviewed and stable  Complications: No apparent anesthesia complications

## 2014-07-23 ENCOUNTER — Encounter (HOSPITAL_COMMUNITY): Payer: Self-pay | Admitting: Cardiology

## 2014-07-23 ENCOUNTER — Ambulatory Visit (INDEPENDENT_AMBULATORY_CARE_PROVIDER_SITE_OTHER): Payer: Medicare Other | Admitting: Cardiology

## 2014-07-23 VITALS — BP 113/62 | HR 60 | Ht 63.0 in | Wt 226.0 lb

## 2014-07-23 DIAGNOSIS — I422 Other hypertrophic cardiomyopathy: Secondary | ICD-10-CM | POA: Diagnosis not present

## 2014-07-23 DIAGNOSIS — I2510 Atherosclerotic heart disease of native coronary artery without angina pectoris: Secondary | ICD-10-CM

## 2014-07-23 DIAGNOSIS — I4891 Unspecified atrial fibrillation: Secondary | ICD-10-CM | POA: Diagnosis not present

## 2014-07-23 NOTE — Progress Notes (Signed)
Clinical Summary Ms. Grieb is a 68 y.o.female regular patient of Dr Bronson Ing, she is seen as an add on today post cardioversion yesterday for afib.  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 Mitral regurgitation -07/2014 echo described as trivial. Mild to moderate MR by recent TEE for TEE/DCCV - denies any SOB or LE edema  3. Chest pain - admit 12/13 to 07/20/14 with chest pain, found to be in afib with RVR. Found to have mildly elevated trop - cath 07/19/14 showed mild diffuse non-obstructive [name]. - no recurrent chest pain since discharge.   4. Afib - new diagnosis earlier this month, admitted with afib with RVR - started on eliquis for stroke prevention. Started on dilt in addition to her beta blocker for rate control - s/p succesful TEE/DCCV 07/22/14 - no recent palpitations.   Past Medical History  Diagnosis Date  . Obesity   . Hypertensive cardiovascular disease   . Left atrial dilatation 2013  . [name] (coronary artery disease) 2008    mild  . Dysrhythmia      Allergies  Allergen Reactions  . Codeine     REACTION: hallucinate     Current Outpatient Prescriptions  Medication Sig Dispense Refill  . apixaban (ELIQUIS) 5 MG TABS tablet Take 1 tablet (5 mg total) by mouth 2 (two) times daily. 60 tablet 11  . aspirin 81 MG tablet Take 1 tablet (81 mg total) by mouth daily.    Marland Kitchen atorvastatin (LIPITOR) 20 MG tablet Take 1 tablet (20 mg total) by mouth daily at 6 PM. 30 tablet 11  . Calcium Carb-Cholecalciferol (CALCIUM + D3) 600-200 MG-UNIT TABS Take 1 tablet by mouth daily.    . cholecalciferol (VITAMIN D) 1000 UNITS tablet Take 2,000 Units by mouth daily.     . Coenzyme Q10 50 MG CAPS Take 1 capsule by mouth daily.    Marland Kitchen diltiazem (CARDIZEM SR) 90 MG 12 hr capsule Take 1 capsule (90 mg total) by mouth 2 (two) times daily. 60 capsule 11  . Flaxseed, Linseed,  (FLAXSEED OIL) 1000 MG CAPS Take 1 capsule by mouth 2 (two) times daily.    . furosemide (LASIX) 20 MG tablet Take 1 tablet (20 mg total) by mouth daily as needed for edema. 30 tablet 3  . lisinopril-hydrochlorothiazide (ZESTORETIC) 10-12.5 MG per tablet Take 1 tablet by mouth daily. 30 tablet 11  . metoprolol (LOPRESSOR) 25 MG tablet Take 3 tablets (75 mg total) by mouth 2 (two) times daily. 180 tablet 11   No current facility-administered medications for this visit.     Past Surgical History  Procedure Laterality Date  . Abdominal hysterectomy    . Left heart catheterization with coronary angiogram N/A 07/19/2014    Procedure: LEFT HEART CATHETERIZATION WITH CORONARY ANGIOGRAM;  Surgeon: Blane Ohara, MD;  Location: [facility] CATH LAB;  Service: Cardiovascular;  Laterality: N/A;  . Abdominal hysterectomy    . Tee without cardioversion N/A 07/22/2014    Procedure: TRANSESOPHAGEAL ECHOCARDIOGRAM (TEE);  Surgeon: Sueanne Margarita, MD;  Location: [facility] ENDOSCOPY;  Service: Cardiovascular;  Laterality: N/A;  . Cardioversion N/A 07/22/2014    Procedure: CARDIOVERSION;  Surgeon: Sueanne Margarita, MD;  Location: [facility] ENDOSCOPY;  Service: Cardiovascular;  Laterality: N/A;     Allergies  Allergen Reactions  . Codeine     REACTION: hallucinate      Family History  Problem Relation Age of Onset  . Hypertension       Social History Ms. Brull reports that she has never smoked. She has never used smokeless tobacco. Ms. Hsu reports that she does not drink alcohol.   Review of Systems CONSTITUTIONAL: No weight loss, fever, chills, weakness or fatigue.  HEENT: Eyes: No visual loss, blurred vision, double vision or yellow sclerae.No hearing loss, sneezing, congestion, runny nose or sore throat.  SKIN: No rash or itching.  CARDIOVASCULAR: per HPI RESPIRATORY: No shortness of breath, cough or sputum.  GASTROINTESTINAL: No anorexia, nausea, vomiting or diarrhea. No abdominal pain or blood.    GENITOURINARY: No burning on urination, no polyuria NEUROLOGICAL: No headache, dizziness, syncope, paralysis, ataxia, numbness or tingling in the extremities. No change in bowel or bladder control.  MUSCULOSKELETAL: No muscle, back pain, joint pain or stiffness.  LYMPHATICS: No enlarged nodes. No history of splenectomy.  PSYCHIATRIC: No history of depression or anxiety.  ENDOCRINOLOGIC: No reports of sweating, cold or heat intolerance. No polyuria or polydipsia.  Marland Kitchen   Physical Examination p 60 bp 113/62 Wt 226 lbs BMI 40 Gen: resting comfortably, no acute distress HEENT: no scleral icterus, pupils equal round and reactive, no palptable cervical adenopathy,  CV: RRR, no m/r/g, no JVD, no carotid bruits Resp: Clear to auscultation bilaterally GI: abdomen is soft, non-tender, non-distended, normal bowel sounds, no hepatosplenomegaly MSK: extremities are warm, no edema.  Skin: warm, no rash Neuro:  no focal deficits Psych: appropriate affect   Diagnostic Studies 07/2014 Echo Study Conclusions  - Left ventricle: The cavity size was normal. Wall thickness was increased in a pattern of severe LVH. Systolic function was normal. The estimated ejection fraction was in the range of 55% to 60%. - Mitral valve: Calcified annulus. Mildly thickened leaflets . - Left atrium: The atrium was moderately dilated. - Atrial septum: No defect or patent foramen ovale was identified.   07/2014 Cath Procedural Findings: Hemodynamics: AO 109-68  Coronary angiography: Coronary dominance: right  Left mainstem: Widely patent, no obstructive disease  Left anterior descending (LAD): Patent throughout. Medium caliber vessel, extends to the distal anterior wall. There are 2 diagonal branches, both small in caliber, without significant stenosis. There are minor diffuse irregularities noted.   Left circumflex (LCx): Large caliber vessel, 2 OM branches are widely patent without  obstruction.  Right coronary artery (RCA): Diffuse nonobstructive disease and mild diffuse calcification noted. The PDA and PLA branches are small but patent. The mid-RCA has 30% stenosis. No high-grade obstructive disease noted.   Left ventriculography: deferred. LVEF normal by echo with severe LVH.   Estimated Blood Loss: minimal  Final Conclusions:  1. Mild diffuse nonobstructive [name] 2. Normal LV function by echo    Assessment and Plan   1. HOCM - no gradient on recent echo, denies any current symptoms. Continue beta blocker therapy  2. Mitral regurgitation - mild to mod by recent TEE, no current symptoms. Continue to follow clinically  3. Chest pain - recent admit with chest pain, mild trop elevation in setting of afib with RVR. Cath showed non-obstructive disease - continue [name] risk factor modification  4. Afib - no diagnosis from recent admit, s/p TEE/DCCV. Remains in NSR. On metop and dilt for rate control, doing well. Continue eliquis for stroke prevention    F/u Dr Bronson Ing late January     Arnoldo Lenis, M.D

## 2014-07-23 NOTE — Patient Instructions (Signed)
Continue all current medications. Follow up late January with Dr. Bronson Ing

## 2014-07-26 ENCOUNTER — Ambulatory Visit: Payer: Medicare Other | Admitting: Cardiology

## 2014-08-02 ENCOUNTER — Encounter: Payer: Medicare Other | Admitting: Cardiovascular Disease

## 2014-08-04 ENCOUNTER — Telehealth: Payer: Self-pay | Admitting: *Deleted

## 2014-08-04 NOTE — Telephone Encounter (Signed)
That is perfectly fine so long as she remains asymptomatic.

## 2014-08-04 NOTE — Telephone Encounter (Signed)
Patient question if okay for her heart rate to be in the mid 50's - 57.  Stated she thought MD had told her to keep between 60-100.  States she feels fine, no dizziness.  Advised patient that normal heart rate range is between 60-100, but will send message to provider for any further advice.  Patient verbalized understanding.    Also, request all her rx for 90 day supply.  Will mail to patient at her request.

## 2014-08-09 MED ORDER — METOPROLOL TARTRATE 25 MG PO TABS: 75.0000 mg | ORAL_TABLET | Freq: Two times a day (BID) | ORAL | Status: DC

## 2014-08-09 MED ORDER — ATORVASTATIN CALCIUM 20 MG PO TABS: 20.0000 mg | ORAL_TABLET | Freq: Every day | ORAL | Status: DC

## 2014-08-09 MED ORDER — LISINOPRIL-HYDROCHLOROTHIAZIDE 10-12.5 MG PO TABS: 1.0000 | ORAL_TABLET | Freq: Every day | ORAL | Status: DC

## 2014-08-09 MED ORDER — APIXABAN 5 MG PO TABS: 5.0000 mg | ORAL_TABLET | Freq: Two times a day (BID) | ORAL | Status: DC

## 2014-08-09 MED ORDER — DILTIAZEM HCL ER 90 MG PO CP12: 90.0000 mg | ORAL_CAPSULE | Freq: Two times a day (BID) | ORAL | Status: DC

## 2014-08-09 NOTE — Telephone Encounter (Signed)
Patient notified.  Will go ahead & send electronic refills on all medications for 90 day supply.  Sent to [employer].

## 2014-09-07 ENCOUNTER — Other Ambulatory Visit: Payer: Self-pay

## 2014-09-07 DIAGNOSIS — J019 Acute sinusitis, unspecified: Secondary | ICD-10-CM | POA: Diagnosis not present

## 2014-09-07 DIAGNOSIS — J209 Acute bronchitis, unspecified: Secondary | ICD-10-CM | POA: Diagnosis not present

## 2014-09-07 DIAGNOSIS — Z1231 Encounter for screening mammogram for malignant neoplasm of breast: Secondary | ICD-10-CM

## 2014-09-14 ENCOUNTER — Ambulatory Visit (INDEPENDENT_AMBULATORY_CARE_PROVIDER_SITE_OTHER): Payer: Medicare Other | Admitting: Cardiovascular Disease

## 2014-09-14 ENCOUNTER — Encounter: Payer: Self-pay | Admitting: Cardiovascular Disease

## 2014-09-14 VITALS — BP 143/81 | HR 56 | Ht 63.0 in | Wt 221.0 lb

## 2014-09-14 DIAGNOSIS — I422 Other hypertrophic cardiomyopathy: Secondary | ICD-10-CM

## 2014-09-14 DIAGNOSIS — I340 Nonrheumatic mitral (valve) insufficiency: Secondary | ICD-10-CM

## 2014-09-14 DIAGNOSIS — I119 Hypertensive heart disease without heart failure: Secondary | ICD-10-CM

## 2014-09-14 DIAGNOSIS — I4891 Unspecified atrial fibrillation: Secondary | ICD-10-CM | POA: Diagnosis not present

## 2014-09-14 DIAGNOSIS — I10 Essential (primary) hypertension: Secondary | ICD-10-CM | POA: Diagnosis not present

## 2014-09-14 DIAGNOSIS — I2510 Atherosclerotic heart disease of native coronary artery without angina pectoris: Secondary | ICD-10-CM | POA: Diagnosis not present

## 2014-09-14 MED ORDER — LISINOPRIL 10 MG PO TABS: 10.0000 mg | ORAL_TABLET | Freq: Every day | ORAL | Status: DC

## 2014-09-14 MED ORDER — LISINOPRIL 5 MG PO TABS: 5.0000 mg | ORAL_TABLET | Freq: Every morning | ORAL | Status: DC

## 2014-09-14 NOTE — Patient Instructions (Addendum)
   Stop Aspirin  Add Lisinopril 10mg  every morning - to be taken in addition to her Zestoretic dose already taking. - new sent to pharmacy today.  Continue all other medications.   Your physician wants you to follow up in: 6 months.  You will receive a reminder letter in the mail one-two months in advance.  If you don't receive a letter, please call our office to schedule the follow up appointment

## 2014-09-14 NOTE — Progress Notes (Signed)
Patient ID: [name], female   DOB: 09-Dec-1945, 69 y.o.   MRN: [id number]      SUBJECTIVE: The patient returns for routine follow-up. She has a history of hypertrophic/hypertensive cardiomyopathy with no significant gradient by most recent echocardiogram. She has mild to moderate mitral regurgitation by transesophageal echocardiogram. She developed newly diagnosed atrial fibrillation with a rapid ventricular response in December 2015 for which she successfully underwent TEE/DCCV on 07/22/2014. She had chest pain and was hospitalized in December 2015 at which time she was in rapid atrial fibrillation. She was found to have a mildly elevated troponin. Coronary angiography on 07/19/2014 showed mild diffuse nonobstructive coronary artery disease. She is feeling well and denies chest pain, palpitations, shortness of breath, and leg swelling. She has not had any bleeding problems. Her systolic blood pressure has been in the 150 range at home. Her physical activity is limited by left knee arthritis.    Review of Systems: As per "subjective", otherwise negative.  Allergies  Allergen Reactions  . Codeine     REACTION: hallucinate    Current Outpatient Prescriptions  Medication Sig Dispense Refill  . apixaban (ELIQUIS) 5 MG TABS tablet Take 1 tablet (5 mg total) by mouth 2 (two) times daily. 180 tablet 3  . aspirin 81 MG tablet Take 1 tablet (81 mg total) by mouth daily.    Marland Kitchen atorvastatin (LIPITOR) 20 MG tablet Take 1 tablet (20 mg total) by mouth daily at 6 PM. 90 tablet 3  . Calcium Carb-Cholecalciferol (CALCIUM + D3) 600-200 MG-UNIT TABS Take 1 tablet by mouth daily.    . cholecalciferol (VITAMIN D) 1000 UNITS tablet Take 2,000 Units by mouth daily.     . Coenzyme Q10 50 MG CAPS Take 1 capsule by mouth daily.    Marland Kitchen diltiazem (CARDIZEM SR) 90 MG 12 hr capsule Take 1 capsule (90 mg total) by mouth 2 (two) times daily. 180 capsule 3  . Flaxseed, Linseed, (FLAXSEED OIL) 1000 MG CAPS Take 1  capsule by mouth 2 (two) times daily.    . furosemide (LASIX) 20 MG tablet Take 1 tablet (20 mg total) by mouth daily as needed for edema. 30 tablet 3  . lisinopril-hydrochlorothiazide (ZESTORETIC) 10-12.5 MG per tablet Take 1 tablet by mouth daily. 90 tablet 3  . metoprolol tartrate (LOPRESSOR) 25 MG tablet Take 3 tablets (75 mg total) by mouth 2 (two) times daily. 540 tablet 3   No current facility-administered medications for this visit.    Past Medical History  Diagnosis Date  . Obesity   . Hypertensive cardiovascular disease   . Left atrial dilatation 2013  . [name] (coronary artery disease) 2008    mild  . Dysrhythmia     Past Surgical History  Procedure Laterality Date  . Abdominal hysterectomy    . Left heart catheterization with coronary angiogram N/A 07/19/2014    Procedure: LEFT HEART CATHETERIZATION WITH CORONARY ANGIOGRAM;  Surgeon: Blane Ohara, MD;  Location: [facility] CATH LAB;  Service: Cardiovascular;  Laterality: N/A;  . Abdominal hysterectomy    . Tee without cardioversion N/A 07/22/2014    Procedure: TRANSESOPHAGEAL ECHOCARDIOGRAM (TEE);  Surgeon: Sueanne Margarita, MD;  Location: [place];  Service: Cardiovascular;  Laterality: N/A;  . Cardioversion N/A 07/22/2014    Procedure: CARDIOVERSION;  Surgeon: Sueanne Margarita, MD;  Location: [facility] ENDOSCOPY;  Service: Cardiovascular;  Laterality: N/A;    History   Social History  . Marital Status: Married    Spouse Name: N/A  Number of Children: N/A  . Years of Education: N/A   Occupational History  . Retired    Social History Main Topics  . Smoking status: Never Smoker   . Smokeless tobacco: Never Used  . Alcohol Use: No  . Drug Use: No  . Sexual Activity: Not on file   Other Topics Concern  . Not on file   Social History Narrative   Lives in [place]   Retired     [place] Vitals:   09/14/14 1040  BP: 143/81  Pulse: 56  Height: 5\' 3"  (1.6 m)  Weight: 221 lb (100.245 kg)    PHYSICAL EXAM General: NAD   Neck: No JVD, no thyromegaly or thyroid nodule.  Lungs: Clear to auscultation bilaterally with normal respiratory effort.  CV: Nondisplaced PMI. Heart regular S1/S2, no S3/S4, II/VI apical holosystolic murmur. No peripheral edema. No carotid bruit. Normal pedal pulses.  Abdomen: Soft, obese, no distention.  Neurologic: Alert and oriented x 3.  Psych: Normal affect. Skin: Normal. Musculoskeletal: No gross deformities. Extremities: No clubbing or cyanosis.   ECG: Most recent ECG reviewed.      ASSESSMENT AND PLAN: 1. Hypertrophic/hypertensive cardiomyopathy: No gradient on recent echo, denies any current symptoms. Continue beta blocker therapy.  2. Mitral regurgitation: Mild to moderate by TEE, no current symptoms. Continue to follow clinically.  3. [name] (mild, nonobstructive): Continue [name] risk factor modification. She is attempting weight loss.  4. Atrial fibrillation: Continue metoprolol and diltiazem for rate control. Continue eliquis for stroke prevention. Will d/c ASA to minimize bleeding risk.  5. Essential HTN: Mildly elevated today. Will given an additional 10 mg of lisinopril in addition to her lisinopril 10 mg-HCTZ 12.5 mg which she already takes.   Dispo: f/u 6 months.   [name], M.D., F.A.C.C.

## 2014-11-15 DIAGNOSIS — E782 Mixed hyperlipidemia: Secondary | ICD-10-CM | POA: Diagnosis not present

## 2014-11-15 DIAGNOSIS — I10 Essential (primary) hypertension: Secondary | ICD-10-CM | POA: Diagnosis not present

## 2014-11-15 DIAGNOSIS — E119 Type 2 diabetes mellitus without complications: Secondary | ICD-10-CM | POA: Diagnosis not present

## 2014-11-17 DIAGNOSIS — L570 Actinic keratosis: Secondary | ICD-10-CM | POA: Diagnosis not present

## 2014-11-22 DIAGNOSIS — I10 Essential (primary) hypertension: Secondary | ICD-10-CM | POA: Diagnosis not present

## 2014-11-22 DIAGNOSIS — I340 Nonrheumatic mitral (valve) insufficiency: Secondary | ICD-10-CM | POA: Diagnosis not present

## 2014-11-22 DIAGNOSIS — E119 Type 2 diabetes mellitus without complications: Secondary | ICD-10-CM | POA: Diagnosis not present

## 2014-11-22 DIAGNOSIS — E782 Mixed hyperlipidemia: Secondary | ICD-10-CM | POA: Diagnosis not present

## 2014-11-22 DIAGNOSIS — I482 Chronic atrial fibrillation: Secondary | ICD-10-CM | POA: Diagnosis not present

## 2015-01-17 ENCOUNTER — Ambulatory Visit
Admission: RE | Admit: 2015-01-17 | Discharge: 2015-01-17 | Disposition: A | Discharge status: Home / Self Care | Payer: Medicare Other | Source: Ambulatory Visit

## 2015-01-17 DIAGNOSIS — Z1231 Encounter for screening mammogram for malignant neoplasm of breast: Secondary | ICD-10-CM | POA: Diagnosis not present

## 2015-01-26 DIAGNOSIS — Z01419 Encounter for gynecological examination (general) (routine) without abnormal findings: Secondary | ICD-10-CM | POA: Diagnosis not present

## 2015-01-26 DIAGNOSIS — Z6839 Body mass index (BMI) 39.0-39.9, adult: Secondary | ICD-10-CM | POA: Diagnosis not present

## 2015-01-26 DIAGNOSIS — Z1272 Encounter for screening for malignant neoplasm of vagina: Secondary | ICD-10-CM | POA: Diagnosis not present

## 2015-03-30 ENCOUNTER — Encounter: Payer: Self-pay | Admitting: Cardiovascular Disease

## 2015-03-30 ENCOUNTER — Ambulatory Visit (INDEPENDENT_AMBULATORY_CARE_PROVIDER_SITE_OTHER): Payer: Medicare Other | Admitting: Cardiovascular Disease

## 2015-03-30 VITALS — BP 144/73 | HR 55 | Ht 63.0 in | Wt 228.0 lb

## 2015-03-30 DIAGNOSIS — I422 Other hypertrophic cardiomyopathy: Secondary | ICD-10-CM

## 2015-03-30 DIAGNOSIS — I119 Hypertensive heart disease without heart failure: Secondary | ICD-10-CM

## 2015-03-30 DIAGNOSIS — I2510 Atherosclerotic heart disease of native coronary artery without angina pectoris: Secondary | ICD-10-CM

## 2015-03-30 DIAGNOSIS — I4891 Unspecified atrial fibrillation: Secondary | ICD-10-CM

## 2015-03-30 DIAGNOSIS — I10 Essential (primary) hypertension: Secondary | ICD-10-CM

## 2015-03-30 NOTE — Progress Notes (Signed)
Patient ID: [name], female   DOB: 1946/06/28, 69 y.o.   MRN: [id number]      SUBJECTIVE: The patient returns for routine follow-up. She has a history of hypertrophic/hypertensive cardiomyopathy with no significant gradient by most recent echocardiogram. She has mild to moderate mitral regurgitation by transesophageal echocardiogram. She developed newly diagnosed atrial fibrillation with a rapid ventricular response in December 2015 for which she successfully underwent TEE/DCCV on 07/22/2014. She had chest pain and was hospitalized in December 2015 at which time she was in rapid atrial fibrillation. She was found to have a mildly elevated troponin. Coronary angiography on 07/19/2014 showed mild diffuse nonobstructive coronary artery disease. She is feeling well and denies chest pain, palpitations, shortness of breath, and leg swelling. BP running in 135/70's for past few months.   Review of Systems: As per "subjective", otherwise negative.  Allergies  Allergen Reactions  . Codeine     REACTION: hallucinate    Current Outpatient Prescriptions  Medication Sig Dispense Refill  . apixaban (ELIQUIS) 5 MG TABS tablet Take 1 tablet (5 mg total) by mouth 2 (two) times daily. 180 tablet 3  . atorvastatin (LIPITOR) 20 MG tablet Take 1 tablet (20 mg total) by mouth daily at 6 PM. 90 tablet 3  . Calcium Carb-Cholecalciferol (CALCIUM + D3) 600-200 MG-UNIT TABS Take 1 tablet by mouth daily.    . cholecalciferol (VITAMIN D) 1000 UNITS tablet Take 2,000 Units by mouth daily.     . Coenzyme Q10 50 MG CAPS Take 1 capsule by mouth daily.    Marland Kitchen diltiazem (CARDIZEM SR) 90 MG 12 hr capsule Take 1 capsule (90 mg total) by mouth 2 (two) times daily. 180 capsule 3  . Flaxseed, Linseed, (FLAXSEED OIL) 1000 MG CAPS Take 1 capsule by mouth 2 (two) times daily.    . furosemide (LASIX) 20 MG tablet Take 1 tablet (20 mg total) by mouth daily as needed for edema. 30 tablet 3  . lisinopril (PRINIVIL,ZESTRIL) 10  MG tablet Take 1 tablet (10 mg total) by mouth daily. 90 tablet 3  . lisinopril-hydrochlorothiazide (ZESTORETIC) 10-12.5 MG per tablet Take 1 tablet by mouth daily. 90 tablet 3  . metoprolol tartrate (LOPRESSOR) 25 MG tablet Take 3 tablets (75 mg total) by mouth 2 (two) times daily. 540 tablet 3   No current facility-administered medications for this visit.    Past Medical History  Diagnosis Date  . Obesity   . Hypertensive cardiovascular disease   . Left atrial dilatation 2013  . [name] (coronary artery disease) 2008    mild  . Dysrhythmia     Past Surgical History  Procedure Laterality Date  . Abdominal hysterectomy    . Left heart catheterization with coronary angiogram N/A 07/19/2014    Procedure: LEFT HEART CATHETERIZATION WITH CORONARY ANGIOGRAM;  Surgeon: Blane Ohara, MD;  Location: [facility] CATH LAB;  Service: Cardiovascular;  Laterality: N/A;  . Abdominal hysterectomy    . Tee without cardioversion N/A 07/22/2014    Procedure: TRANSESOPHAGEAL ECHOCARDIOGRAM (TEE);  Surgeon: Sueanne Margarita, MD;  Location: [place];  Service: Cardiovascular;  Laterality: N/A;  . Cardioversion N/A 07/22/2014    Procedure: CARDIOVERSION;  Surgeon: Sueanne Margarita, MD;  Location: [facility] ENDOSCOPY;  Service: Cardiovascular;  Laterality: N/A;    Social History   Social History  . Marital Status: Married    Spouse Name: N/A  . Number of Children: N/A  . Years of Education: N/A   Occupational History  . Retired  Social History Main Topics  . Smoking status: Never Smoker   . Smokeless tobacco: Never Used  . Alcohol Use: No  . Drug Use: No  . Sexual Activity: Not on file   Other Topics Concern  . Not on file   Social History Narrative   Lives in [place]   Retired     [place]:   03/30/15 0813  BP: 144/73  Pulse: 55  Height: 5\' 3"  (1.6 m)  Weight: 228 lb (103.42 kg)    PHYSICAL EXAM General: NAD  Neck: No JVD, no thyromegaly or thyroid nodule.  Lungs: Clear to  auscultation bilaterally with normal respiratory effort.  CV: Nondisplaced PMI. Heart regular S1/S2, no S3/S4, II/VI apical holosystolic murmur. No peripheral edema. No carotid bruit. Normal pedal pulses.  Abdomen: Soft, obese, no distention.  Neurologic: Alert and oriented x 3.  Psych: Normal affect. Skin: Normal. Musculoskeletal: No gross deformities. Extremities: No clubbing or cyanosis.   ECG: Most recent ECG reviewed.      ASSESSMENT AND PLAN: 1. Hypertrophic/hypertensive cardiomyopathy: No gradient on recent echo, denies any current symptoms. Continue beta blocker therapy.  2. Mitral regurgitation: Mild to moderate by TEE, no current symptoms. Continue to follow clinically.  3. [name] (mild, nonobstructive): Continue [name] risk factor modification.   4. Atrial fibrillation: Continue metoprolol and diltiazem for rate control. Continue eliquis for stroke prevention.   5. Essential HTN: Mildly elevated today, but normal at home. No changes to meds. In near future once her meds need to be refilled, will combine lisinopril-HCTZ to 20-12.5 mg tablet daily.  Dispo: f/u 6 months.   [name], M.D., F.A.C.C.

## 2015-03-30 NOTE — Patient Instructions (Signed)
Continue all current medications. Your physician wants you to follow up in: 6 months.  You will receive a reminder letter in the mail one-two months in advance.  If you don't receive a letter, please call our office to schedule the follow up appointment   

## 2015-04-08 DIAGNOSIS — E782 Mixed hyperlipidemia: Secondary | ICD-10-CM | POA: Diagnosis not present

## 2015-04-08 DIAGNOSIS — I482 Chronic atrial fibrillation: Secondary | ICD-10-CM | POA: Diagnosis not present

## 2015-04-08 DIAGNOSIS — I10 Essential (primary) hypertension: Secondary | ICD-10-CM | POA: Diagnosis not present

## 2015-04-08 DIAGNOSIS — E119 Type 2 diabetes mellitus without complications: Secondary | ICD-10-CM | POA: Diagnosis not present

## 2015-04-12 DIAGNOSIS — E782 Mixed hyperlipidemia: Secondary | ICD-10-CM | POA: Diagnosis not present

## 2015-04-12 DIAGNOSIS — E119 Type 2 diabetes mellitus without complications: Secondary | ICD-10-CM | POA: Diagnosis not present

## 2015-04-12 DIAGNOSIS — I482 Chronic atrial fibrillation: Secondary | ICD-10-CM | POA: Diagnosis not present

## 2015-04-12 DIAGNOSIS — E6609 Other obesity due to excess calories: Secondary | ICD-10-CM | POA: Diagnosis not present

## 2015-04-12 DIAGNOSIS — Z23 Encounter for immunization: Secondary | ICD-10-CM | POA: Diagnosis not present

## 2015-04-12 DIAGNOSIS — I10 Essential (primary) hypertension: Secondary | ICD-10-CM | POA: Diagnosis not present

## 2015-04-12 DIAGNOSIS — I421 Obstructive hypertrophic cardiomyopathy: Secondary | ICD-10-CM | POA: Diagnosis not present

## 2015-04-21 DIAGNOSIS — M85851 Other specified disorders of bone density and structure, right thigh: Secondary | ICD-10-CM | POA: Diagnosis not present

## 2015-04-21 DIAGNOSIS — Z79899 Other long term (current) drug therapy: Secondary | ICD-10-CM | POA: Diagnosis not present

## 2015-04-21 DIAGNOSIS — E780 Pure hypercholesterolemia: Secondary | ICD-10-CM | POA: Diagnosis not present

## 2015-04-21 DIAGNOSIS — I10 Essential (primary) hypertension: Secondary | ICD-10-CM | POA: Diagnosis not present

## 2015-04-21 DIAGNOSIS — M810 Age-related osteoporosis without current pathological fracture: Secondary | ICD-10-CM | POA: Diagnosis not present

## 2015-04-21 DIAGNOSIS — Z780 Asymptomatic menopausal state: Secondary | ICD-10-CM | POA: Diagnosis not present

## 2015-04-21 DIAGNOSIS — I4891 Unspecified atrial fibrillation: Secondary | ICD-10-CM | POA: Diagnosis not present

## 2015-04-21 DIAGNOSIS — M8589 Other specified disorders of bone density and structure, multiple sites: Secondary | ICD-10-CM | POA: Diagnosis not present

## 2015-04-21 DIAGNOSIS — Z7901 Long term (current) use of anticoagulants: Secondary | ICD-10-CM | POA: Diagnosis not present

## 2015-04-21 DIAGNOSIS — M85852 Other specified disorders of bone density and structure, left thigh: Secondary | ICD-10-CM | POA: Diagnosis not present

## 2015-05-04 ENCOUNTER — Telehealth: Payer: Self-pay | Admitting: *Deleted

## 2015-05-04 ENCOUNTER — Ambulatory Visit (INDEPENDENT_AMBULATORY_CARE_PROVIDER_SITE_OTHER): Payer: Medicare Other | Admitting: *Deleted

## 2015-05-04 ENCOUNTER — Encounter: Payer: Self-pay | Admitting: *Deleted

## 2015-05-04 VITALS — BP 119/79 | HR 86 | Ht 63.0 in | Wt 224.0 lb

## 2015-05-04 DIAGNOSIS — I4891 Unspecified atrial fibrillation: Secondary | ICD-10-CM

## 2015-05-04 MED ORDER — METOPROLOL TARTRATE 100 MG PO TABS: 100.0000 mg | ORAL_TABLET | Freq: Two times a day (BID) | ORAL | Status: DC

## 2015-05-04 NOTE — Progress Notes (Signed)
Patient walked into office with the c/o feeling jittery that started last night. Patient denied chest pain, dizziness or sob. Patient's medications were reviewed during visit. Patient has taken all doses of medications without any side effects noted. An EKG was obtained and routed to Dr. Bronson Ing. DOD also reviewed EKG and deferred plan to Dr. Bronson Ing.

## 2015-05-04 NOTE — Telephone Encounter (Signed)
Patient informed and verbalized understanding of plan. 

## 2015-05-04 NOTE — Telephone Encounter (Signed)
-----   Message from Herminio Commons, MD sent at 05/04/2015 12:02 PM EDT ----- Atrial fibrillation has returned. She is anticoagulated. Increase metoprolol to 100 mg bid to see if this helps to improve her symptoms.  Can have her return to see me in 4-6 weeks.  ----- Message -----    From: Merlene Laughter, LPN    Sent: 6/73/4193  11:56 AM      To: Herminio Commons, MD  Please review this so that I can contact the patient with a plan.

## 2015-06-08 ENCOUNTER — Encounter: Payer: Self-pay | Admitting: Cardiovascular Disease

## 2015-06-08 ENCOUNTER — Ambulatory Visit (INDEPENDENT_AMBULATORY_CARE_PROVIDER_SITE_OTHER): Payer: Medicare Other | Admitting: Cardiovascular Disease

## 2015-06-08 VITALS — BP 143/81 | HR 80 | Ht 63.0 in | Wt 228.0 lb

## 2015-06-08 DIAGNOSIS — I2510 Atherosclerotic heart disease of native coronary artery without angina pectoris: Secondary | ICD-10-CM | POA: Diagnosis not present

## 2015-06-08 DIAGNOSIS — I4891 Unspecified atrial fibrillation: Secondary | ICD-10-CM

## 2015-06-08 DIAGNOSIS — I10 Essential (primary) hypertension: Secondary | ICD-10-CM

## 2015-06-08 DIAGNOSIS — R609 Edema, unspecified: Secondary | ICD-10-CM | POA: Diagnosis not present

## 2015-06-08 DIAGNOSIS — I340 Nonrheumatic mitral (valve) insufficiency: Secondary | ICD-10-CM

## 2015-06-08 DIAGNOSIS — T50905A Adverse effect of unspecified drugs, medicaments and biological substances, initial encounter: Secondary | ICD-10-CM

## 2015-06-08 DIAGNOSIS — R635 Abnormal weight gain: Secondary | ICD-10-CM

## 2015-06-08 DIAGNOSIS — I119 Hypertensive heart disease without heart failure: Secondary | ICD-10-CM

## 2015-06-08 DIAGNOSIS — I481 Persistent atrial fibrillation: Secondary | ICD-10-CM

## 2015-06-08 DIAGNOSIS — R140 Abdominal distension (gaseous): Secondary | ICD-10-CM

## 2015-06-08 DIAGNOSIS — I422 Other hypertrophic cardiomyopathy: Secondary | ICD-10-CM

## 2015-06-08 DIAGNOSIS — I4819 Other persistent atrial fibrillation: Secondary | ICD-10-CM

## 2015-06-08 MED ORDER — FUROSEMIDE 20 MG PO TABS: 20.0000 mg | ORAL_TABLET | ORAL | Status: DC

## 2015-06-08 MED ORDER — POTASSIUM CHLORIDE ER 10 MEQ PO TBCR: 10.0000 meq | EXTENDED_RELEASE_TABLET | ORAL | Status: DC

## 2015-06-08 NOTE — Patient Instructions (Addendum)
   Take your Lasix 20mg  every other day x 2 weeks only.   Potassium 59meq every other day x 2 weeks only - new sent to pharmacy today. Continue all other medications.   Lab for BMET - do prior to going out of town. Office will contact with results via phone or letter.   Referral to EP - Dr. Rayann Heman  Your physician wants you to follow up in: 6 months.  You will receive a reminder letter in the mail one-two months in advance.  If you don't receive a letter, please call our office to schedule the follow up appointment

## 2015-06-08 NOTE — Progress Notes (Signed)
Patient ID: [name], female   DOB: 04-11-1946, 69 y.o.   MRN: [id number]      SUBJECTIVE: The patient returns for routine follow-up. She has a history of hypertrophic/hypertensive cardiomyopathy with no significant gradient by most recent echocardiogram. She has mild to moderate mitral regurgitation by transesophageal echocardiogram. She developed newly diagnosed atrial fibrillation with a rapid ventricular response in December 2015 for which she successfully underwent TEE/DCCV on 07/22/2014. She had chest pain and was hospitalized in December 2015 at which time she was in rapid atrial fibrillation. She was found to have a mildly elevated troponin. Coronary angiography on 07/19/2014 showed mild diffuse nonobstructive coronary artery disease.  She was stable at her last visit with me in August 2016. She presented to the clinic on 9/28 with complaints of feeling jittery. ECG demonstrated atrial fibrillation, heart rate 85 bpm, with LVH and repolarization abnormalities. I increased metoprolol to 100 mg bid.  ECG performed in the office today demonstrates atrial fibrillation, heart rate 68 bpm.  She has multiple questions related to atrial fibrillation and medication side effects. She has not noticed ankle swelling or worsening shortness of breath, but has noticed weight gain since increasing the metoprolol dosage and has had some abdominal distention. She has not taken Lasix. She denies palpitations and chest pain.  She has reduced caffeine intake and food consumption and still has noticed weight gain.   Review of Systems: As per "subjective", otherwise negative.  Allergies  Allergen Reactions  . Codeine     REACTION: hallucinate    Current Outpatient Prescriptions  Medication Sig Dispense Refill  . apixaban (ELIQUIS) 5 MG TABS tablet Take 1 tablet (5 mg total) by mouth 2 (two) times daily. 180 tablet 3  . atorvastatin (LIPITOR) 20 MG tablet Take 1 tablet (20 mg total) by mouth  daily at 6 PM. 90 tablet 3  . Calcium Carb-Cholecalciferol (CALCIUM + D3) 600-200 MG-UNIT TABS Take 1 tablet by mouth daily.    . cholecalciferol (VITAMIN D) 1000 UNITS tablet Take 2,000 Units by mouth daily.     . Coenzyme Q10 50 MG CAPS Take 1 capsule by mouth daily.    Marland Kitchen diltiazem (CARDIZEM SR) 90 MG 12 hr capsule Take 1 capsule (90 mg total) by mouth 2 (two) times daily. 180 capsule 3  . Flaxseed, Linseed, (FLAXSEED OIL) 1000 MG CAPS Take 1 capsule by mouth 2 (two) times daily.    . furosemide (LASIX) 20 MG tablet Take 1 tablet (20 mg total) by mouth daily as needed for edema. 30 tablet 3  . lisinopril (PRINIVIL,ZESTRIL) 10 MG tablet Take 1 tablet (10 mg total) by mouth daily. 90 tablet 3  . lisinopril-hydrochlorothiazide (ZESTORETIC) 10-12.5 MG per tablet Take 1 tablet by mouth daily. 90 tablet 3  . metoprolol (LOPRESSOR) 100 MG tablet Take 1 tablet (100 mg total) by mouth 2 (two) times daily. 180 tablet 1   No current facility-administered medications for this visit.    Past Medical History  Diagnosis Date  . Obesity   . Hypertensive cardiovascular disease   . Left atrial dilatation 2013  . [name] (coronary artery disease) 2008    mild  . Dysrhythmia     Past Surgical History  Procedure Laterality Date  . Abdominal hysterectomy    . Left heart catheterization with coronary angiogram N/A 07/19/2014    Procedure: LEFT HEART CATHETERIZATION WITH CORONARY ANGIOGRAM;  Surgeon: Blane Ohara, MD;  Location[facility] CATH LAB;  Service: Cardiovascular;  Laterality: N/A;  .  Abdominal hysterectomy    . Tee without cardioversion N/A 07/22/2014    Procedure: TRANSESOPHAGEAL ECHOCARDIOGRAM (TEE);  Surgeon: Sueanne Margarita, MD;  Location: [place];  Service: Cardiovascular;  Laterality: N/A;  . Cardioversion N/A 07/22/2014    Procedure: CARDIOVERSION;  Surgeon: Sueanne Margarita, MD;  Location: [facility] ENDOSCOPY;  Service: Cardiovascular;  Laterality: N/A;    Social History   Social History  .  Marital Status: Married    Spouse Name: N/A  . Number of Children: N/A  . Years of Education: N/A   Occupational History  . Retired    Social History Main Topics  . Smoking status: Never Smoker   . Smokeless tobacco: Never Used  . Alcohol Use: No  . Drug Use: No  . Sexual Activity: Not on file   Other Topics Concern  . Not on file   Social History Narrative   Lives in [place]   Retired     [place] Vitals:   06/08/15 0856  BP: 143/81  Pulse: 80  Height: 5\' 3"  (1.6 m)  Weight: 228 lb (103.42 kg)    PHYSICAL EXAM General: NAD  Neck: No JVD, no thyromegaly or thyroid nodule.  Lungs: Clear to auscultation bilaterally with normal respiratory effort.  CV: Nondisplaced PMI. Regular rate, irregular rhythm, normal S1/S2, no S3, II/VI apical holosystolic murmur. No peripheral edema.  Abdomen: Soft, obese, no distention.  Neurologic: Alert and oriented x 3.  Psych: Normal affect. Skin: Normal. Musculoskeletal: No gross deformities. Extremities: No clubbing or cyanosis.   ECG: Most recent ECG reviewed.   ASSESSMENT AND PLAN: 1. Hypertrophic/hypertensive cardiomyopathy: No gradient on recent echo, denies any current symptoms. Continue beta blocker therapy.  2. Mitral regurgitation: Mild to moderate by TEE, no current symptoms. Continue to follow clinically.  3. [name] (mild, nonobstructive): Continue [name] risk factor modification.   4. Atrial fibrillation: Continue metoprolol and diltiazem for rate control. Continue eliquis for stroke prevention. Will make referral to Dr. Rayann Heman (EP) for additional therapeutic considerations (medical, ablation, etc).  5. Essential HTN: Mildly elevated today. No changes to meds.  6. Weight gain/abdominal distention: May be related to both atrial fibrillation and increased metoprolol dosage. Will have her take Lasix 20 mg qod with KCl 10 meq to see if this serves to alleviate symptoms. Will check BMET next week.  Dispo: f/u 6 months.  Time  spent: 40 minutes, of which greater than 50% was spent reviewing symptoms, relevant blood tests and studies, and discussing management plan with the patient.   [name], M.D., F.A.C.C.

## 2015-06-13 DIAGNOSIS — I10 Essential (primary) hypertension: Secondary | ICD-10-CM | POA: Diagnosis not present

## 2015-06-13 DIAGNOSIS — R609 Edema, unspecified: Secondary | ICD-10-CM | POA: Diagnosis not present

## 2015-06-15 ENCOUNTER — Telehealth: Payer: Self-pay | Admitting: *Deleted

## 2015-06-15 NOTE — Telephone Encounter (Signed)
Notes Recorded by Laurine Blazer, LPN on 38/03/7194 at 97:47 PM Patient notified via voice mail.

## 2015-06-15 NOTE — Telephone Encounter (Signed)
-----   Message from Herminio Commons, MD sent at 06/15/2015 11:11 AM EST ----- Good.

## 2015-07-04 ENCOUNTER — Ambulatory Visit (INDEPENDENT_AMBULATORY_CARE_PROVIDER_SITE_OTHER): Payer: Medicare Other | Admitting: Internal Medicine

## 2015-07-04 ENCOUNTER — Encounter: Payer: Self-pay | Admitting: Internal Medicine

## 2015-07-04 VITALS — BP 130/82 | HR 74 | Ht 63.0 in | Wt 228.2 lb

## 2015-07-04 DIAGNOSIS — I10 Essential (primary) hypertension: Secondary | ICD-10-CM | POA: Diagnosis not present

## 2015-07-04 DIAGNOSIS — I2510 Atherosclerotic heart disease of native coronary artery without angina pectoris: Secondary | ICD-10-CM

## 2015-07-04 DIAGNOSIS — I481 Persistent atrial fibrillation: Secondary | ICD-10-CM | POA: Diagnosis not present

## 2015-07-04 DIAGNOSIS — I4819 Other persistent atrial fibrillation: Secondary | ICD-10-CM

## 2015-07-04 NOTE — Patient Instructions (Addendum)
Medication Instructions:  Your physician has recommended you make the following change in your medication:  1) STOP Potassium     Labwork: None ordered   Testing/Procedures: Your physician has requested that you have an echocardiogram. Echocardiography is a painless test that uses sound waves to create images of your heart. It provides your doctor with information about the size and shape of your heart and how well your heart's chambers and valves are working. This procedure takes approximately one hour. There are no restrictions for this procedure.---in [place]    Follow-Up: Your physician recommends that you schedule a follow-up appointment in: 3 months with Dr Bronson Ing in [place] and as needed with Dr Rayann Heman   Any Other Special Instructions Will Be Listed Below (If Applicable).     If you need a refill on your cardiac medications before your next appointment, please call your pharmacy.

## 2015-07-04 NOTE — Progress Notes (Signed)
Electrophysiology Office Note   Date:  07/04/2015   ID:  [name], DOB 1946/01/16, MRN [id number]  PCP:  Gar Ponto, MD  Cardiologist:  Dr Bronson Ing Primary Electrophysiologist: Thompson Grayer, MD    Chief Complaint  Patient presents with  . Atrial Fibrillation     History of Present Illness: [name] is a 69 y.o. female who presents today for electrophysiology evaluation.   The patient has symptomatic persistent atrial fibrillation.  I first saw her 12/15 after she presented with AFib with RVR and chest pain.  She underwent cath which revealed nonobstructive [name] and subsequently was cardioverted.  She has advanced atriopathy as well as obesity.  She actually did very well over the past year without return of afib until early November.  She presented to Dr Bronson Ing 06/08/15 and was found to have returned to afib.  She reports significant palpitations and fatigue at that time.  She was treated with rate control and referred back to me for additional evaluation.   With adequate rate control, the patient reports doing "much better".  Today she states "I feel fine".  She is pleased with her current health state and is without symptoms of AF.  Today, she denies symptoms of palpitations, chest pain, shortness of breath, orthopnea, PND, lower extremity edema, claudication, dizziness, presyncope, syncope, bleeding, or neurologic sequela. The patient is tolerating medications without difficulties and is otherwise without complaint today.    Past Medical History  Diagnosis Date  . Obesity   . Hypertensive cardiovascular disease   . Left atrial dilatation 2013  . [name] (coronary artery disease) 2008    mild  . Persistent atrial fibrillation [facility])    Past Surgical History  Procedure Laterality Date  . Abdominal hysterectomy    . Left heart catheterization with coronary angiogram N/A 07/19/2014    Procedure: LEFT HEART CATHETERIZATION WITH CORONARY ANGIOGRAM;  Surgeon:  Blane Ohara, MD;  Location: [facility] CATH LAB;  Service: Cardiovascular;  Laterality: N/A;  . Abdominal hysterectomy    . Tee without cardioversion N/A 07/22/2014    Procedure: TRANSESOPHAGEAL ECHOCARDIOGRAM (TEE);  Surgeon: Sueanne Margarita, MD;  Location: [place];  Service: Cardiovascular;  Laterality: N/A;  . Cardioversion N/A 07/22/2014    Procedure: CARDIOVERSION;  Surgeon: Sueanne Margarita, MD;  Location: [place] ENDOSCOPY;  Service: Cardiovascular;  Laterality: N/A;     Current Outpatient Prescriptions  Medication Sig Dispense Refill  . apixaban (ELIQUIS) 5 MG TABS tablet Take 1 tablet (5 mg total) by mouth 2 (two) times daily. 180 tablet 3  . atorvastatin (LIPITOR) 20 MG tablet Take 1 tablet (20 mg total) by mouth daily at 6 PM. 90 tablet 3  . Calcium Carb-Cholecalciferol (CALCIUM + D3) 600-200 MG-UNIT TABS Take 1 tablet by mouth daily.    . cholecalciferol (VITAMIN D) 1000 UNITS tablet Take 2,000 Units by mouth daily.     . Coenzyme Q10 50 MG CAPS Take 1 capsule by mouth daily.    Marland Kitchen diltiazem (CARDIZEM SR) 90 MG 12 hr capsule Take 1 capsule (90 mg total) by mouth 2 (two) times daily. 180 capsule 3  . Flaxseed, Linseed, (FLAXSEED OIL) 1000 MG CAPS Take 1 capsule by mouth 2 (two) times daily.    . furosemide (LASIX) 20 MG tablet Take 1 tablet (20 mg total) by mouth every other day.    . lisinopril (PRINIVIL,ZESTRIL) 10 MG tablet Take 1 tablet (10 mg total) by mouth daily. 90 tablet 3  . lisinopril-hydrochlorothiazide (ZESTORETIC) 10-12.5  MG per tablet Take 1 tablet by mouth daily. 90 tablet 3  . metoprolol (LOPRESSOR) 100 MG tablet Take 1 tablet (100 mg total) by mouth 2 (two) times daily. 180 tablet 1   No current facility-administered medications for this visit.    Allergies:   Codeine   Social History:  The patient  reports that she has never smoked. She has never used smokeless tobacco. She reports that she does not drink alcohol or use illicit drugs.   Family History:  The  patient's  family history includes Hypertension in an other family member.    ROS:  Please see the history of present illness.   All other systems are reviewed and negative.    PHYSICAL EXAM: VS:  BP 130/82 mmHg  Pulse 74  Ht 5\' 3"  (1.6 m)  Wt 228 lb 3.2 oz (103.511 kg)  BMI 40.43 kg/m2 , BMI Body mass index is 40.43 kg/(m^2). GEN: overweight in no acute distress HEENT: normal Neck: no JVD, carotid bruits, or masses Cardiac: iRRR; no murmurs, rubs, or gallops,no edema  Respiratory:  clear to auscultation bilaterally, normal work of breathing GI: soft, nontender, nondistended, + BS MS: no deformity or atrophy Skin: warm and dry  Neuro:  Strength and sensation are intact Psych: euthymic mood, full affect  EKG:  EKG is ordered today. The ekg ordered today shows afib, V rate 74 bpm, septal infarct, nonspecific ST/T changes   Recent Labs: 07/19/2014: BUN 8; Creatinine, Ser 0.49*; Hemoglobin 12.8; Platelets 194; Potassium 4.5; Sodium 140; TSH 2.660    Lipid Panel     Component Value Date/Time   CHOL 146 07/19/2014 0213   TRIG 234* 07/19/2014 0213   HDL 32* 07/19/2014 0213   CHOLHDL 4.6 07/19/2014 0213   VLDL 47* 07/19/2014 0213   LDLCALC 67 07/19/2014 0213     Wt Readings from Last 3 Encounters:  07/04/15 228 lb 3.2 oz (103.511 kg)  06/08/15 228 lb (103.42 kg)  05/04/15 224 lb (101.606 kg)      Other studies Reviewed: Additional studies/ records that were reviewed today include: prior hospital records, Dr Raylene Everts notes, prior echo   Review of the above records today demonstrates: echo 12/15 reveals preserved EF with severe LVH, moderate LA enlargement, with LA 10mm   ASSESSMENT AND PLAN:  1.  Persistent atrial fibrillation The patient has recurrent persistent afib.  Though she has been previously quite symptomatic, presently, she feels well with rate control. Therapeutic strategies for afib including rate and rhythm control were discussed in detail with the  patient today.  I offered rate control, vs cardioversion, vs hospitalization for tikosyn.  (Given persistent afib, she would require failing AAD prior to consideration for ablation). I did encourage her to consider cardioversion at this time (even off of AAD therapy if she wasn't interesed in [place]).  She is very clear at this time that she is not interested in any additional procedures including cardioversion or hospitalization for tikosyn.  As she feels well, she has opted for rate control at this time. I will obtain an echo at this time to evaluate for structural changes related to her afib.  Chads2vasc score is at least 3.  She is appropriately anticoagulated. No changes are therefore made today.  2. Obesity Weight loss/ lifestyle modification discussed at length today  3. HTN Stable No change required today  She will follow-up with Dr Bronson Ing in 3 months.  If she remains asymptomatic, it may be reasonable to continue rate control as  a long term option. She is aware to contact my office should she decide to consider rhythm control or should she develop progressive symptoms with her afib.  I will plan to see as needed.   Current medicines are reviewed at length with the patient today.   The patient does not have concerns regarding her medicines.  The following changes were made today:  none  Labs/ tests ordered today include:  Orders Placed This Encounter  Procedures  . EKG 12-Lead  . ECHOCARDIOGRAM COMPLETE     Signed, Thompson Grayer, MD  07/04/2015 8:59 PM     [address] [place] [phone] (office) [phone] (fax)

## 2015-07-21 ENCOUNTER — Other Ambulatory Visit: Payer: Self-pay

## 2015-07-21 ENCOUNTER — Ambulatory Visit (INDEPENDENT_AMBULATORY_CARE_PROVIDER_SITE_OTHER): Payer: Medicare Other

## 2015-07-21 ENCOUNTER — Other Ambulatory Visit: Payer: Self-pay | Admitting: Internal Medicine

## 2015-07-21 DIAGNOSIS — R002 Palpitations: Secondary | ICD-10-CM

## 2015-07-21 DIAGNOSIS — R5382 Chronic fatigue, unspecified: Secondary | ICD-10-CM | POA: Diagnosis not present

## 2015-07-21 DIAGNOSIS — I4819 Other persistent atrial fibrillation: Secondary | ICD-10-CM

## 2015-07-21 DIAGNOSIS — I429 Cardiomyopathy, unspecified: Secondary | ICD-10-CM

## 2015-07-21 DIAGNOSIS — I481 Persistent atrial fibrillation: Secondary | ICD-10-CM

## 2015-07-29 ENCOUNTER — Other Ambulatory Visit: Payer: Self-pay | Admitting: Cardiovascular Disease

## 2015-07-29 DIAGNOSIS — S20219A Contusion of unspecified front wall of thorax, initial encounter: Secondary | ICD-10-CM | POA: Diagnosis not present

## 2015-08-09 DIAGNOSIS — S83412A Sprain of medial collateral ligament of left knee, initial encounter: Secondary | ICD-10-CM | POA: Diagnosis not present

## 2015-08-23 DIAGNOSIS — M170 Bilateral primary osteoarthritis of knee: Secondary | ICD-10-CM | POA: Diagnosis not present

## 2015-08-30 DIAGNOSIS — I10 Essential (primary) hypertension: Secondary | ICD-10-CM | POA: Diagnosis not present

## 2015-08-30 DIAGNOSIS — E119 Type 2 diabetes mellitus without complications: Secondary | ICD-10-CM | POA: Diagnosis not present

## 2015-08-30 DIAGNOSIS — E782 Mixed hyperlipidemia: Secondary | ICD-10-CM | POA: Diagnosis not present

## 2015-09-06 DIAGNOSIS — I10 Essential (primary) hypertension: Secondary | ICD-10-CM | POA: Diagnosis not present

## 2015-09-06 DIAGNOSIS — I482 Chronic atrial fibrillation: Secondary | ICD-10-CM | POA: Diagnosis not present

## 2015-09-06 DIAGNOSIS — E782 Mixed hyperlipidemia: Secondary | ICD-10-CM | POA: Diagnosis not present

## 2015-09-06 DIAGNOSIS — I340 Nonrheumatic mitral (valve) insufficiency: Secondary | ICD-10-CM | POA: Diagnosis not present

## 2015-09-06 DIAGNOSIS — E6609 Other obesity due to excess calories: Secondary | ICD-10-CM | POA: Diagnosis not present

## 2015-09-06 DIAGNOSIS — E119 Type 2 diabetes mellitus without complications: Secondary | ICD-10-CM | POA: Diagnosis not present

## 2015-09-18 DIAGNOSIS — K047 Periapical abscess without sinus: Secondary | ICD-10-CM | POA: Diagnosis not present

## 2015-09-18 DIAGNOSIS — I10 Essential (primary) hypertension: Secondary | ICD-10-CM | POA: Diagnosis not present

## 2015-09-26 ENCOUNTER — Other Ambulatory Visit: Payer: Self-pay

## 2015-09-26 DIAGNOSIS — Z1231 Encounter for screening mammogram for malignant neoplasm of breast: Secondary | ICD-10-CM

## 2015-10-05 ENCOUNTER — Encounter: Payer: Self-pay | Admitting: Cardiovascular Disease

## 2015-10-05 ENCOUNTER — Ambulatory Visit (INDEPENDENT_AMBULATORY_CARE_PROVIDER_SITE_OTHER): Payer: Medicare Other | Admitting: Cardiovascular Disease

## 2015-10-05 VITALS — BP 121/73 | HR 65 | Ht 63.0 in | Wt 213.0 lb

## 2015-10-05 DIAGNOSIS — I481 Persistent atrial fibrillation: Secondary | ICD-10-CM

## 2015-10-05 DIAGNOSIS — I422 Other hypertrophic cardiomyopathy: Secondary | ICD-10-CM | POA: Diagnosis not present

## 2015-10-05 DIAGNOSIS — I119 Hypertensive heart disease without heart failure: Secondary | ICD-10-CM

## 2015-10-05 DIAGNOSIS — I4819 Other persistent atrial fibrillation: Secondary | ICD-10-CM

## 2015-10-05 DIAGNOSIS — I10 Essential (primary) hypertension: Secondary | ICD-10-CM | POA: Diagnosis not present

## 2015-10-05 DIAGNOSIS — I340 Nonrheumatic mitral (valve) insufficiency: Secondary | ICD-10-CM

## 2015-10-05 MED ORDER — FUROSEMIDE 20 MG PO TABS: 20.0000 mg | ORAL_TABLET | ORAL | Status: DC

## 2015-10-05 NOTE — Patient Instructions (Signed)
Your physician wants you to follow-up in: [place] Bronson Ing You will receive a reminder letter in the mail two months in advance. If you don't receive a letter, please call our office to schedule the follow-up appointment.  Your physician recommends that you continue on your current medications as directed. Please refer to the Current Medication list given to you today.  TAKE METOPROLOL 50 MG TWICE DAILY  Thank you for choosing [facility]!!

## 2015-10-05 NOTE — Progress Notes (Signed)
Patient ID: [name], female   DOB: February 01, 1946, 70 y.o.   MRN: [id number]      SUBJECTIVE: The patient returns for routine follow-up. She has a history of hypertrophic/hypertensive cardiomyopathy. She developed newly diagnosed atrial fibrillation with a rapid ventricular response in December 2015 for which she successfully underwent TEE/DCCV on 07/22/2014.  She had chest pain and was hospitalized in December 2015 at which time she was in rapid atrial fibrillation. She was found to have a mildly elevated troponin. Coronary angiography on 07/19/2014 showed mild diffuse nonobstructive coronary artery disease.  Most recent echocardiogram performed on 07/21/15 demonstrated normal left ventricular systolic function, EF 123456 , mild concentric LVH, high filling pressures , and moderate mitral regurgitation.  She has been dehydrated recently when walking in [place] but has otherwise felt well. Mild dizziness during this episode but no syncope. Denies worsening of baseline exertional dyspnea.     Review of Systems: As per "subjective", otherwise negative.  Allergies  Allergen Reactions  . Codeine     REACTION: hallucinate    Current Outpatient Prescriptions  Medication Sig Dispense Refill  . atorvastatin (LIPITOR) 20 MG tablet TAKE ONE TABLET BY MOUTH ONCE DAILY AT 6:00PM 90 tablet 1  . Calcium Carb-Cholecalciferol (CALCIUM + D3) 600-200 MG-UNIT TABS Take 1 tablet by mouth daily.    . cholecalciferol (VITAMIN D) 1000 UNITS tablet Take 2,000 Units by mouth daily.     . Coenzyme Q10 50 MG CAPS Take 1 capsule by mouth daily.    Marland Kitchen diltiazem (CARDIZEM SR) 90 MG 12 hr capsule TAKE ONE CAPSULE BY MOUTH TWICE DAILY 180 capsule 1  . ELIQUIS 5 MG TABS tablet TAKE ONE TABLET BY MOUTH TWICE DAILY 180 tablet 1  . Flaxseed, Linseed, (FLAXSEED OIL) 1000 MG CAPS Take 1 capsule by mouth 2 (two) times daily.    . furosemide (LASIX) 20 MG tablet Take 1 tablet (20 mg total) by mouth every other day.      . lisinopril-hydrochlorothiazide (PRINZIDE,ZESTORETIC) 20-12.5 MG tablet Take 2 tablets by mouth daily.     . metoprolol (LOPRESSOR) 100 MG tablet Take 1 tablet by mouth daily. Decreased by Dr. Quillian Quince    . traMADol (ULTRAM) 50 MG tablet Take 1 tablet by mouth daily as needed.     No current facility-administered medications for this visit.    Past Medical History  Diagnosis Date  . Obesity   . Hypertensive cardiovascular disease   . Left atrial dilatation 2013  . [name] (coronary artery disease) 2008    mild  . Persistent atrial fibrillation [facility])     Past Surgical History  Procedure Laterality Date  . Abdominal hysterectomy    . Left heart catheterization with coronary angiogram N/A 07/19/2014    Procedure: LEFT HEART CATHETERIZATION WITH CORONARY ANGIOGRAM;  Surgeon: Blane Ohara, MD;  Location: [facility] CATH LAB;  Service: Cardiovascular;  Laterality: N/A;  . Abdominal hysterectomy    . Tee without cardioversion N/A 07/22/2014    Procedure: TRANSESOPHAGEAL ECHOCARDIOGRAM (TEE);  Surgeon: Sueanne Margarita, MD;  Location: [place];  Service: Cardiovascular;  Laterality: N/A;  . Cardioversion N/A 07/22/2014    Procedure: CARDIOVERSION;  Surgeon: Sueanne Margarita, MD;  Location: [facility] ENDOSCOPY;  Service: Cardiovascular;  Laterality: N/A;    Social History   Social History  . Marital Status: Married    Spouse Name: N/A  . Number of Children: N/A  . Years of Education: N/A   Occupational History  . Retired    Science writer  History Main Topics  . Smoking status: Never Smoker   . Smokeless tobacco: Never Used  . Alcohol Use: No  . Drug Use: No  . Sexual Activity: Not on file   Other Topics Concern  . Not on file   Social History Narrative   [place] in [place]   Retired     [place] Vitals:   10/05/15 0950  BP: 121/73  Pulse: 65  Height: 5\' 3"  (1.6 m)  Weight: 213 lb (96.616 kg)  SpO2: 93%    PHYSICAL EXAM General: NAD  Neck: No JVD, no thyromegaly or thyroid nodule.   Lungs: Clear to auscultation bilaterally with normal respiratory effort.  CV: Nondisplaced PMI. Regular rate, irregular rhythm, normal S1/S2, no S3, II/VI apical holosystolic murmur. No peripheral edema.  Abdomen: Soft, obese, no distention.  Neurologic: Alert and oriented x 3.  Psych: Normal affect. Skin: Normal. Musculoskeletal: No gross deformities. Extremities: No clubbing or cyanosis.   ECG: Most recent ECG reviewed.      ASSESSMENT AND PLAN: 1. Hypertrophic/hypertensive cardiomyopathy: No gradient on recent echo, denies any current symptoms. Continue beta blocker therapy (metoprolol 50 mg bid).  2. Mitral regurgitation: Moderate as noted above. No current symptoms. Continue to follow clinically.  3. [name] (mild, nonobstructive): Continue [name] risk factor modification.   4. Persistent atrial fibrillation: Continue metoprolol and diltiazem for rate control. Continue eliquis for stroke prevention. Was offered ablation and antiarrhythmic therapy by Dr. Rayann Heman but deferred.  5. Essential HTN: Controlled. No changes to meds.  Dispo: f/u 1 year.  [name], M.D., F.A.C.C.

## 2015-10-07 ENCOUNTER — Other Ambulatory Visit: Payer: Self-pay | Admitting: *Deleted

## 2015-10-07 MED ORDER — FUROSEMIDE 20 MG PO TABS: 20.0000 mg | ORAL_TABLET | Freq: Every day | ORAL | Status: DC | PRN

## 2015-11-08 DIAGNOSIS — H25013 Cortical age-related cataract, bilateral: Secondary | ICD-10-CM | POA: Diagnosis not present

## 2015-11-08 DIAGNOSIS — H2513 Age-related nuclear cataract, bilateral: Secondary | ICD-10-CM | POA: Diagnosis not present

## 2015-11-11 ENCOUNTER — Other Ambulatory Visit: Payer: Self-pay | Admitting: Cardiovascular Disease

## 2015-11-14 ENCOUNTER — Other Ambulatory Visit: Payer: Self-pay | Admitting: Cardiovascular Disease

## 2015-11-16 DIAGNOSIS — L821 Other seborrheic keratosis: Secondary | ICD-10-CM | POA: Diagnosis not present

## 2015-11-16 DIAGNOSIS — L570 Actinic keratosis: Secondary | ICD-10-CM | POA: Diagnosis not present

## 2016-01-05 DIAGNOSIS — E119 Type 2 diabetes mellitus without complications: Secondary | ICD-10-CM | POA: Diagnosis not present

## 2016-01-05 DIAGNOSIS — E782 Mixed hyperlipidemia: Secondary | ICD-10-CM | POA: Diagnosis not present

## 2016-01-05 DIAGNOSIS — I10 Essential (primary) hypertension: Secondary | ICD-10-CM | POA: Diagnosis not present

## 2016-01-12 DIAGNOSIS — I10 Essential (primary) hypertension: Secondary | ICD-10-CM | POA: Diagnosis not present

## 2016-01-12 DIAGNOSIS — Z1389 Encounter for screening for other disorder: Secondary | ICD-10-CM | POA: Diagnosis not present

## 2016-01-12 DIAGNOSIS — E782 Mixed hyperlipidemia: Secondary | ICD-10-CM | POA: Diagnosis not present

## 2016-01-12 DIAGNOSIS — I340 Nonrheumatic mitral (valve) insufficiency: Secondary | ICD-10-CM | POA: Diagnosis not present

## 2016-01-12 DIAGNOSIS — I482 Chronic atrial fibrillation: Secondary | ICD-10-CM | POA: Diagnosis not present

## 2016-01-12 DIAGNOSIS — E6609 Other obesity due to excess calories: Secondary | ICD-10-CM | POA: Diagnosis not present

## 2016-01-12 DIAGNOSIS — E119 Type 2 diabetes mellitus without complications: Secondary | ICD-10-CM | POA: Diagnosis not present

## 2016-01-12 DIAGNOSIS — Z9189 Other specified personal risk factors, not elsewhere classified: Secondary | ICD-10-CM | POA: Diagnosis not present

## 2016-01-21 ENCOUNTER — Other Ambulatory Visit: Payer: Self-pay | Admitting: Cardiovascular Disease

## 2016-01-24 ENCOUNTER — Ambulatory Visit
Admission: RE | Admit: 2016-01-24 | Discharge: 2016-01-24 | Disposition: A | Discharge status: Home / Self Care | Payer: Medicare Other | Source: Ambulatory Visit

## 2016-01-24 DIAGNOSIS — Z1231 Encounter for screening mammogram for malignant neoplasm of breast: Secondary | ICD-10-CM | POA: Diagnosis not present

## 2016-01-27 DIAGNOSIS — Z01419 Encounter for gynecological examination (general) (routine) without abnormal findings: Secondary | ICD-10-CM | POA: Diagnosis not present

## 2016-01-27 DIAGNOSIS — Z87411 Personal history of vaginal dysplasia: Secondary | ICD-10-CM | POA: Diagnosis not present

## 2016-02-14 DIAGNOSIS — I482 Chronic atrial fibrillation: Secondary | ICD-10-CM | POA: Diagnosis not present

## 2016-02-14 DIAGNOSIS — Z7901 Long term (current) use of anticoagulants: Secondary | ICD-10-CM | POA: Diagnosis not present

## 2016-02-14 DIAGNOSIS — Z836 Family history of other diseases of the respiratory system: Secondary | ICD-10-CM | POA: Diagnosis not present

## 2016-02-14 DIAGNOSIS — Z886 Allergy status to analgesic agent status: Secondary | ICD-10-CM | POA: Diagnosis not present

## 2016-02-14 DIAGNOSIS — Z1211 Encounter for screening for malignant neoplasm of colon: Secondary | ICD-10-CM | POA: Diagnosis not present

## 2016-02-14 DIAGNOSIS — Z79899 Other long term (current) drug therapy: Secondary | ICD-10-CM | POA: Diagnosis not present

## 2016-02-14 DIAGNOSIS — I10 Essential (primary) hypertension: Secondary | ICD-10-CM | POA: Diagnosis not present

## 2016-03-30 ENCOUNTER — Other Ambulatory Visit: Payer: Self-pay

## 2016-04-02 ENCOUNTER — Telehealth: Payer: Self-pay | Admitting: Cardiovascular Disease

## 2016-04-02 NOTE — Telephone Encounter (Signed)
Mrs. Makarewicz called in regards to questions about diltiazem (CARDIZEM SR) 90 MG

## 2016-04-02 NOTE — Telephone Encounter (Signed)
Gingival hyperplasia is an extremely rare side effect of diltiazem but apparently has been reported in <2% of cases before. She may benefit from trying verapamil instead. Can you please forward to our [place] pharmacist to review and recommend an equivalent dose of verapamil? She is on diltiazem SR 90mg  BID. The pharmacy here at [facility] did not have specific information available for dose conversion. Thank you! Dayna Dunn PA-C

## 2016-04-02 NOTE — Telephone Encounter (Signed)
Pt says recent visit with Dr. Abigail Miyamoto (dentist) for gum swelling that has gradually gotten worse, dentist suggested it may have been infection - gave pt antibiotics this past week - went back to dentist for f/u today who says the swelling hasn't gone down with antibiotics and says may be side affect of diltiazem and suggested pt call us if diltiazem may need to be switched. Pt says she had no pain and didn't notice the swelling until dentist visit - pt denies any other symptoms. Will forward to covering provider

## 2016-04-03 MED ORDER — VERAPAMIL HCL ER 180 MG PO TBCR
180.0000 mg | EXTENDED_RELEASE_TABLET | Freq: Every day | ORAL | 3 refills | Status: DC
Start: 2016-04-03 — End: 2016-07-10

## 2016-04-03 NOTE — Telephone Encounter (Signed)
Per Leeroy Bock, RPH switch to verapamil extended release 180mg  once daily and have pt monitor blood pressure and any change in afib symptoms  Ok to let pt know and send medication in?

## 2016-04-03 NOTE — Telephone Encounter (Signed)
Perfect. Thank you! Remind patient this medicine can cause constipation so good to eat fiber and stay hydrated. Dayna Dunn PA-C

## 2016-04-03 NOTE — Telephone Encounter (Signed)
Pt aware updated medication list, Medication sent to pharmacy.

## 2016-05-02 DIAGNOSIS — J209 Acute bronchitis, unspecified: Secondary | ICD-10-CM | POA: Diagnosis not present

## 2016-05-02 DIAGNOSIS — J019 Acute sinusitis, unspecified: Secondary | ICD-10-CM | POA: Diagnosis not present

## 2016-05-02 DIAGNOSIS — Z6837 Body mass index (BMI) 37.0-37.9, adult: Secondary | ICD-10-CM | POA: Diagnosis not present

## 2016-05-09 DIAGNOSIS — E119 Type 2 diabetes mellitus without complications: Secondary | ICD-10-CM | POA: Diagnosis not present

## 2016-05-09 DIAGNOSIS — E782 Mixed hyperlipidemia: Secondary | ICD-10-CM | POA: Diagnosis not present

## 2016-05-09 DIAGNOSIS — I10 Essential (primary) hypertension: Secondary | ICD-10-CM | POA: Diagnosis not present

## 2016-05-11 DIAGNOSIS — Z6836 Body mass index (BMI) 36.0-36.9, adult: Secondary | ICD-10-CM | POA: Diagnosis not present

## 2016-05-11 DIAGNOSIS — E782 Mixed hyperlipidemia: Secondary | ICD-10-CM | POA: Diagnosis not present

## 2016-05-11 DIAGNOSIS — E6609 Other obesity due to excess calories: Secondary | ICD-10-CM | POA: Diagnosis not present

## 2016-05-11 DIAGNOSIS — I482 Chronic atrial fibrillation: Secondary | ICD-10-CM | POA: Diagnosis not present

## 2016-05-11 DIAGNOSIS — I10 Essential (primary) hypertension: Secondary | ICD-10-CM | POA: Diagnosis not present

## 2016-05-11 DIAGNOSIS — I340 Nonrheumatic mitral (valve) insufficiency: Secondary | ICD-10-CM | POA: Diagnosis not present

## 2016-05-11 DIAGNOSIS — Z23 Encounter for immunization: Secondary | ICD-10-CM | POA: Diagnosis not present

## 2016-05-11 DIAGNOSIS — E119 Type 2 diabetes mellitus without complications: Secondary | ICD-10-CM | POA: Diagnosis not present

## 2016-07-10 ENCOUNTER — Other Ambulatory Visit: Payer: Self-pay | Admitting: *Deleted

## 2016-07-10 MED ORDER — VERAPAMIL HCL ER 180 MG PO TBCR: 180.0000 mg | EXTENDED_RELEASE_TABLET | Freq: Every day | ORAL | 1 refills | Status: DC

## 2016-09-03 ENCOUNTER — Telehealth: Payer: Self-pay | Admitting: Cardiovascular Disease

## 2016-09-03 NOTE — Telephone Encounter (Signed)
recvd a letter from [employer] stating they will no longer cover eliquis

## 2016-09-03 NOTE — Telephone Encounter (Signed)
Pt called back says she spoke with insurance and will actually cover Eliquis - pt also asked what other similar medications would be covered pt was told Eldridge Abrahams. Pt just wanted this added to her chart for future reference - pt didn't want refills at this time says she would update pharmacy at upcoming [place]

## 2016-09-03 NOTE — Telephone Encounter (Signed)
Patient advised to contact her insurance to see which anticoagulant they will cover in the place of eliquis. Patient verbalized understanding of plan.

## 2016-09-06 DIAGNOSIS — E119 Type 2 diabetes mellitus without complications: Secondary | ICD-10-CM | POA: Diagnosis not present

## 2016-09-06 DIAGNOSIS — Z9189 Other specified personal risk factors, not elsewhere classified: Secondary | ICD-10-CM | POA: Diagnosis not present

## 2016-09-06 DIAGNOSIS — E782 Mixed hyperlipidemia: Secondary | ICD-10-CM | POA: Diagnosis not present

## 2016-09-06 DIAGNOSIS — I10 Essential (primary) hypertension: Secondary | ICD-10-CM | POA: Diagnosis not present

## 2016-09-11 DIAGNOSIS — Z6837 Body mass index (BMI) 37.0-37.9, adult: Secondary | ICD-10-CM | POA: Diagnosis not present

## 2016-09-11 DIAGNOSIS — E6609 Other obesity due to excess calories: Secondary | ICD-10-CM | POA: Diagnosis not present

## 2016-09-11 DIAGNOSIS — Z23 Encounter for immunization: Secondary | ICD-10-CM | POA: Diagnosis not present

## 2016-09-11 DIAGNOSIS — E119 Type 2 diabetes mellitus without complications: Secondary | ICD-10-CM | POA: Diagnosis not present

## 2016-09-11 DIAGNOSIS — I10 Essential (primary) hypertension: Secondary | ICD-10-CM | POA: Diagnosis not present

## 2016-09-11 DIAGNOSIS — I340 Nonrheumatic mitral (valve) insufficiency: Secondary | ICD-10-CM | POA: Diagnosis not present

## 2016-09-11 DIAGNOSIS — E782 Mixed hyperlipidemia: Secondary | ICD-10-CM | POA: Diagnosis not present

## 2016-09-11 DIAGNOSIS — I482 Chronic atrial fibrillation: Secondary | ICD-10-CM | POA: Diagnosis not present

## 2016-09-13 DIAGNOSIS — Z79899 Other long term (current) drug therapy: Secondary | ICD-10-CM | POA: Diagnosis not present

## 2016-09-13 DIAGNOSIS — Z90710 Acquired absence of both cervix and uterus: Secondary | ICD-10-CM | POA: Diagnosis not present

## 2016-09-13 DIAGNOSIS — Z836 Family history of other diseases of the respiratory system: Secondary | ICD-10-CM | POA: Diagnosis not present

## 2016-09-13 DIAGNOSIS — T45525A Adverse effect of antithrombotic drugs, initial encounter: Secondary | ICD-10-CM | POA: Diagnosis not present

## 2016-09-13 DIAGNOSIS — K0889 Other specified disorders of teeth and supporting structures: Secondary | ICD-10-CM | POA: Diagnosis not present

## 2016-09-13 DIAGNOSIS — Z886 Allergy status to analgesic agent status: Secondary | ICD-10-CM | POA: Diagnosis not present

## 2016-09-13 DIAGNOSIS — Z98818 Other dental procedure status: Secondary | ICD-10-CM | POA: Diagnosis not present

## 2016-09-13 DIAGNOSIS — E8881 Metabolic syndrome: Secondary | ICD-10-CM | POA: Diagnosis not present

## 2016-09-13 DIAGNOSIS — D6832 Hemorrhagic disorder due to extrinsic circulating anticoagulants: Secondary | ICD-10-CM | POA: Diagnosis not present

## 2016-09-13 DIAGNOSIS — Z8249 Family history of ischemic heart disease and other diseases of the circulatory system: Secondary | ICD-10-CM | POA: Diagnosis not present

## 2016-09-13 DIAGNOSIS — I482 Chronic atrial fibrillation: Secondary | ICD-10-CM | POA: Diagnosis not present

## 2016-09-13 DIAGNOSIS — E785 Hyperlipidemia, unspecified: Secondary | ICD-10-CM | POA: Diagnosis not present

## 2016-09-13 DIAGNOSIS — I10 Essential (primary) hypertension: Secondary | ICD-10-CM | POA: Diagnosis not present

## 2016-09-13 DIAGNOSIS — Z888 Allergy status to other drugs, medicaments and biological substances status: Secondary | ICD-10-CM | POA: Diagnosis not present

## 2016-09-13 DIAGNOSIS — K91841 Postprocedural hemorrhage and hematoma of a digestive system organ or structure following other procedure: Secondary | ICD-10-CM | POA: Diagnosis not present

## 2016-09-13 DIAGNOSIS — K068 Other specified disorders of gingiva and edentulous alveolar ridge: Secondary | ICD-10-CM | POA: Diagnosis not present

## 2016-09-13 DIAGNOSIS — Z6837 Body mass index (BMI) 37.0-37.9, adult: Secondary | ICD-10-CM | POA: Diagnosis not present

## 2016-09-13 DIAGNOSIS — Z7902 Long term (current) use of antithrombotics/antiplatelets: Secondary | ICD-10-CM | POA: Diagnosis not present

## 2016-09-14 DIAGNOSIS — D65 Disseminated intravascular coagulation [defibrination syndrome]: Secondary | ICD-10-CM | POA: Diagnosis not present

## 2016-10-05 ENCOUNTER — Encounter: Payer: Self-pay | Admitting: *Deleted

## 2016-10-08 ENCOUNTER — Encounter: Payer: Self-pay | Admitting: *Deleted

## 2016-10-08 ENCOUNTER — Ambulatory Visit (INDEPENDENT_AMBULATORY_CARE_PROVIDER_SITE_OTHER): Payer: Medicare Other | Admitting: Cardiovascular Disease

## 2016-10-08 ENCOUNTER — Encounter: Payer: Self-pay | Admitting: Cardiovascular Disease

## 2016-10-08 VITALS — BP 150/88 | HR 75 | Ht 64.0 in | Wt 233.0 lb

## 2016-10-08 DIAGNOSIS — I422 Other hypertrophic cardiomyopathy: Secondary | ICD-10-CM | POA: Diagnosis not present

## 2016-10-08 DIAGNOSIS — I119 Hypertensive heart disease without heart failure: Secondary | ICD-10-CM

## 2016-10-08 DIAGNOSIS — I4819 Other persistent atrial fibrillation: Secondary | ICD-10-CM

## 2016-10-08 DIAGNOSIS — I10 Essential (primary) hypertension: Secondary | ICD-10-CM | POA: Diagnosis not present

## 2016-10-08 DIAGNOSIS — I481 Persistent atrial fibrillation: Secondary | ICD-10-CM | POA: Diagnosis not present

## 2016-10-08 DIAGNOSIS — I340 Nonrheumatic mitral (valve) insufficiency: Secondary | ICD-10-CM | POA: Diagnosis not present

## 2016-10-08 NOTE — Progress Notes (Signed)
SUBJECTIVE: The patient returns for routine follow-up. She has a history of hypertrophic/hypertensive cardiomyopathy. She developed newly diagnosed atrial fibrillation with a rapid ventricular response in December 2015 for which she successfully underwent TEE/DCCV on 07/22/2014.  She had chest pain and was hospitalized in December 2015 at which time she was in rapid atrial fibrillation. She was found to have a mildly elevated troponin.  Coronary angiography on 07/19/2014 showed mild diffuse nonobstructive coronary artery disease.  Most recent echocardiogram performed on 07/21/15 demonstrated normal left ventricular systolic function, EF 123456 , mild concentric LVH, high filling pressures , and moderate mitral regurgitation.  ECG performed in the office today shows rate-controlled atrial fibrillation.  Had a tooth extraction on 09/13/16 and bled profusely and had to be evaluated in ED.  Has put on weight due to inactivity. Denies chest pain and shortness of breath.   Review of Systems: As per "subjective", otherwise negative.  Allergies  Allergen Reactions  . Codeine     REACTION: hallucinate    Current Outpatient Prescriptions  Medication Sig Dispense Refill  . atorvastatin (LIPITOR) 20 MG tablet TAKE ONE TABLET BY MOUTH ONCE DAILY AT  6:00  PM 90 tablet 3  . Calcium Carb-Cholecalciferol (CALCIUM + D3) 600-200 MG-UNIT TABS Take 1 tablet by mouth daily.    . cholecalciferol (VITAMIN D) 1000 UNITS tablet Take 2,000 Units by mouth daily.     . Coenzyme Q10 50 MG CAPS Take 1 capsule by mouth daily.    Marland Kitchen ELIQUIS 5 MG TABS tablet TAKE ONE TABLET BY MOUTH TWICE DAILY 180 tablet 3  . Flaxseed, Linseed, (FLAXSEED OIL) 1000 MG CAPS Take 1 capsule by mouth 2 (two) times daily.    Marland Kitchen lisinopril-hydrochlorothiazide (PRINZIDE,ZESTORETIC) 20-12.5 MG tablet Take 2 tablets by mouth daily.     . metoprolol (LOPRESSOR) 100 MG tablet Take 0.5 tablets (50 mg total) by mouth 2 (two) times daily.  90 tablet 3  . verapamil (CALAN-SR) 180 MG CR tablet Take 1 tablet (180 mg total) by mouth daily. 90 tablet 1   No current facility-administered medications for this visit.     Past Medical History:  Diagnosis Date  . [name] (coronary artery disease) 2008   mild  . Hypertensive cardiovascular disease   . Left atrial dilatation 2013  . Obesity   . Persistent atrial fibrillation [facility])     Past Surgical History:  Procedure Laterality Date  . ABDOMINAL HYSTERECTOMY    . ABDOMINAL HYSTERECTOMY    . CARDIOVERSION N/A 07/22/2014   Procedure: CARDIOVERSION;  Surgeon: Sueanne Margarita, MD;  Location: [place] ENDOSCOPY;  Service: Cardiovascular;  Laterality: N/A;  . LEFT HEART CATHETERIZATION WITH CORONARY ANGIOGRAM N/A 07/19/2014   Procedure: LEFT HEART CATHETERIZATION WITH CORONARY ANGIOGRAM;  Surgeon: Blane Ohara, MD;  Location: [facility] CATH LAB;  Service: Cardiovascular;  Laterality: N/A;  . TEE WITHOUT CARDIOVERSION N/A 07/22/2014   Procedure: TRANSESOPHAGEAL ECHOCARDIOGRAM (TEE);  Surgeon: Sueanne Margarita, MD;  Location: [facility] ENDOSCOPY;  Service: Cardiovascular;  Laterality: N/A;    Social History   Social History  . Marital status: Married    Spouse name: N/A  . Number of children: N/A  . Years of education: N/A   Occupational History  . Retired    Social History Main Topics  . Smoking status: Never Smoker  . Smokeless tobacco: Never Used  . Alcohol use No  . Drug use: No  . Sexual activity: Not on file   Other Topics Concern  .  Not on file   Social History Narrative   Lives in [place]   Retired     Vitals:   10/08/16 0918  BP: (!) 150/88  Pulse: 75  SpO2: 92%  Weight: 233 lb (105.7 kg)  Height: 5\' 4"  (1.626 m)    PHYSICAL EXAM General: NAD  Neck: No JVD, no thyromegaly or thyroid nodule.  Lungs: Clear to auscultation bilaterally with normal respiratory effort.  CV: Nondisplaced PMI. Regular rate, irregular rhythm, normal S1/S2, no S3, II/VI apical holosystolic  murmur. No peripheral edema.  Abdomen: Soft, obese, no distention.  Neurologic: Alert and oriented x 3.  Psych: Normal affect. Skin: Normal. Musculoskeletal: No gross deformities. Extremities: No clubbing or cyanosis.     ECG: Most recent ECG reviewed.      ASSESSMENT AND PLAN:  1. Hypertrophic/hypertensive cardiomyopathy: No gradient on most recent echo, denies any current symptoms. Continue beta blocker therapy (metoprolol 50 mg bid).  2. Mitral regurgitation: Moderate as noted above. No current symptoms. Continue to follow clinically.  3. [name] (mild, nonobstructive): Continue [name] risk factor modification.   4. Persistent atrial fibrillation: Symptomatically stable. Continue metoprolol and diltiazem for rate control. Continue eliquis for stroke prevention. Was offered ablation and antiarrhythmic therapy by Dr. Rayann Heman but deferred.  5. Essential HTN: Elevated. Needs weight loss and continued monitoring.  Dispo: f/u 1 year.   [name], M.D., F.A.C.C.

## 2016-10-08 NOTE — Patient Instructions (Signed)

## 2016-10-09 ENCOUNTER — Other Ambulatory Visit: Payer: Self-pay | Admitting: Unknown Physician Specialty

## 2016-10-09 DIAGNOSIS — Z1231 Encounter for screening mammogram for malignant neoplasm of breast: Secondary | ICD-10-CM

## 2016-10-29 ENCOUNTER — Other Ambulatory Visit: Payer: Self-pay | Admitting: *Deleted

## 2016-10-29 MED ORDER — METOPROLOL TARTRATE 100 MG PO TABS: 50.0000 mg | ORAL_TABLET | Freq: Two times a day (BID) | ORAL | 3 refills | Status: DC

## 2016-11-07 DIAGNOSIS — J111 Influenza due to unidentified influenza virus with other respiratory manifestations: Secondary | ICD-10-CM | POA: Diagnosis not present

## 2016-11-07 DIAGNOSIS — Z6837 Body mass index (BMI) 37.0-37.9, adult: Secondary | ICD-10-CM | POA: Diagnosis not present

## 2016-11-07 DIAGNOSIS — R05 Cough: Secondary | ICD-10-CM | POA: Diagnosis not present

## 2016-11-20 DIAGNOSIS — Z23 Encounter for immunization: Secondary | ICD-10-CM | POA: Diagnosis not present

## 2016-12-17 DIAGNOSIS — L821 Other seborrheic keratosis: Secondary | ICD-10-CM | POA: Diagnosis not present

## 2016-12-17 DIAGNOSIS — L280 Lichen simplex chronicus: Secondary | ICD-10-CM | POA: Diagnosis not present

## 2016-12-17 DIAGNOSIS — L570 Actinic keratosis: Secondary | ICD-10-CM | POA: Diagnosis not present

## 2017-01-09 DIAGNOSIS — E119 Type 2 diabetes mellitus without complications: Secondary | ICD-10-CM | POA: Diagnosis not present

## 2017-01-09 DIAGNOSIS — I10 Essential (primary) hypertension: Secondary | ICD-10-CM | POA: Diagnosis not present

## 2017-01-09 DIAGNOSIS — Z9189 Other specified personal risk factors, not elsewhere classified: Secondary | ICD-10-CM | POA: Diagnosis not present

## 2017-01-09 DIAGNOSIS — E782 Mixed hyperlipidemia: Secondary | ICD-10-CM | POA: Diagnosis not present

## 2017-01-09 DIAGNOSIS — I482 Chronic atrial fibrillation: Secondary | ICD-10-CM | POA: Diagnosis not present

## 2017-01-09 DIAGNOSIS — I340 Nonrheumatic mitral (valve) insufficiency: Secondary | ICD-10-CM | POA: Diagnosis not present

## 2017-01-11 DIAGNOSIS — I5032 Chronic diastolic (congestive) heart failure: Secondary | ICD-10-CM | POA: Diagnosis not present

## 2017-01-11 DIAGNOSIS — I10 Essential (primary) hypertension: Secondary | ICD-10-CM | POA: Diagnosis not present

## 2017-01-11 DIAGNOSIS — E119 Type 2 diabetes mellitus without complications: Secondary | ICD-10-CM | POA: Diagnosis not present

## 2017-01-11 DIAGNOSIS — I482 Chronic atrial fibrillation: Secondary | ICD-10-CM | POA: Diagnosis not present

## 2017-01-11 DIAGNOSIS — Z6836 Body mass index (BMI) 36.0-36.9, adult: Secondary | ICD-10-CM | POA: Diagnosis not present

## 2017-01-11 DIAGNOSIS — I340 Nonrheumatic mitral (valve) insufficiency: Secondary | ICD-10-CM | POA: Diagnosis not present

## 2017-01-11 DIAGNOSIS — E782 Mixed hyperlipidemia: Secondary | ICD-10-CM | POA: Diagnosis not present

## 2017-01-26 ENCOUNTER — Other Ambulatory Visit: Payer: Self-pay | Admitting: Cardiovascular Disease

## 2017-01-28 DIAGNOSIS — Z6837 Body mass index (BMI) 37.0-37.9, adult: Secondary | ICD-10-CM | POA: Diagnosis not present

## 2017-01-28 DIAGNOSIS — Z01419 Encounter for gynecological examination (general) (routine) without abnormal findings: Secondary | ICD-10-CM | POA: Diagnosis not present

## 2017-01-28 DIAGNOSIS — Z1272 Encounter for screening for malignant neoplasm of vagina: Secondary | ICD-10-CM | POA: Diagnosis not present

## 2017-01-29 ENCOUNTER — Ambulatory Visit
Admission: RE | Admit: 2017-01-29 | Discharge: 2017-01-29 | Disposition: A | Discharge status: Home / Self Care | Payer: Medicare Other | Source: Ambulatory Visit | Attending: Unknown Physician Specialty | Admitting: Unknown Physician Specialty

## 2017-01-29 DIAGNOSIS — Z1231 Encounter for screening mammogram for malignant neoplasm of breast: Secondary | ICD-10-CM | POA: Diagnosis not present

## 2017-02-12 ENCOUNTER — Other Ambulatory Visit: Payer: Self-pay | Admitting: Cardiovascular Disease

## 2017-02-26 DIAGNOSIS — Z6836 Body mass index (BMI) 36.0-36.9, adult: Secondary | ICD-10-CM | POA: Diagnosis not present

## 2017-02-26 DIAGNOSIS — D1721 Benign lipomatous neoplasm of skin and subcutaneous tissue of right arm: Secondary | ICD-10-CM | POA: Diagnosis not present

## 2017-04-24 ENCOUNTER — Other Ambulatory Visit: Payer: Self-pay | Admitting: Cardiovascular Disease

## 2017-05-16 DIAGNOSIS — Z9189 Other specified personal risk factors, not elsewhere classified: Secondary | ICD-10-CM | POA: Diagnosis not present

## 2017-05-16 DIAGNOSIS — I340 Nonrheumatic mitral (valve) insufficiency: Secondary | ICD-10-CM | POA: Diagnosis not present

## 2017-05-16 DIAGNOSIS — D1721 Benign lipomatous neoplasm of skin and subcutaneous tissue of right arm: Secondary | ICD-10-CM | POA: Diagnosis not present

## 2017-05-16 DIAGNOSIS — I5032 Chronic diastolic (congestive) heart failure: Secondary | ICD-10-CM | POA: Diagnosis not present

## 2017-05-16 DIAGNOSIS — I482 Chronic atrial fibrillation: Secondary | ICD-10-CM | POA: Diagnosis not present

## 2017-05-16 DIAGNOSIS — I10 Essential (primary) hypertension: Secondary | ICD-10-CM | POA: Diagnosis not present

## 2017-05-16 DIAGNOSIS — E782 Mixed hyperlipidemia: Secondary | ICD-10-CM | POA: Diagnosis not present

## 2017-05-16 DIAGNOSIS — E119 Type 2 diabetes mellitus without complications: Secondary | ICD-10-CM | POA: Diagnosis not present

## 2017-05-20 ENCOUNTER — Other Ambulatory Visit: Payer: Self-pay | Admitting: Cardiovascular Disease

## 2017-05-20 DIAGNOSIS — Z23 Encounter for immunization: Secondary | ICD-10-CM | POA: Diagnosis not present

## 2017-05-20 DIAGNOSIS — I340 Nonrheumatic mitral (valve) insufficiency: Secondary | ICD-10-CM | POA: Diagnosis not present

## 2017-05-20 DIAGNOSIS — I5032 Chronic diastolic (congestive) heart failure: Secondary | ICD-10-CM | POA: Diagnosis not present

## 2017-05-20 DIAGNOSIS — E119 Type 2 diabetes mellitus without complications: Secondary | ICD-10-CM | POA: Diagnosis not present

## 2017-05-20 DIAGNOSIS — E782 Mixed hyperlipidemia: Secondary | ICD-10-CM | POA: Diagnosis not present

## 2017-05-20 DIAGNOSIS — Z6836 Body mass index (BMI) 36.0-36.9, adult: Secondary | ICD-10-CM | POA: Diagnosis not present

## 2017-05-20 DIAGNOSIS — I482 Chronic atrial fibrillation: Secondary | ICD-10-CM | POA: Diagnosis not present

## 2017-05-20 DIAGNOSIS — I10 Essential (primary) hypertension: Secondary | ICD-10-CM | POA: Diagnosis not present

## 2017-08-30 ENCOUNTER — Other Ambulatory Visit: Payer: Self-pay | Admitting: Unknown Physician Specialty

## 2017-08-30 DIAGNOSIS — Z1231 Encounter for screening mammogram for malignant neoplasm of breast: Secondary | ICD-10-CM

## 2017-09-05 ENCOUNTER — Telehealth: Payer: Self-pay | Admitting: Cardiovascular Disease

## 2017-09-05 NOTE — Telephone Encounter (Signed)
Patient is needing front tooth extracted on Tuesday Wanted to when she needs to stop her medication.   Dr Abigail Miyamoto

## 2017-09-05 NOTE — Telephone Encounter (Signed)
Patient on Eliquis 5mg  twice a day.  Does have yearly check up scheduled for 10/15/2017.

## 2017-09-05 NOTE — Telephone Encounter (Signed)
Patient notified and verbalized understanding. 

## 2017-09-05 NOTE — Telephone Encounter (Signed)
Can hold 2 days prior to procedure.

## 2017-09-23 DIAGNOSIS — E119 Type 2 diabetes mellitus without complications: Secondary | ICD-10-CM | POA: Diagnosis not present

## 2017-09-23 DIAGNOSIS — I482 Chronic atrial fibrillation: Secondary | ICD-10-CM | POA: Diagnosis not present

## 2017-09-23 DIAGNOSIS — I10 Essential (primary) hypertension: Secondary | ICD-10-CM | POA: Diagnosis not present

## 2017-09-23 DIAGNOSIS — E782 Mixed hyperlipidemia: Secondary | ICD-10-CM | POA: Diagnosis not present

## 2017-09-23 DIAGNOSIS — Z9189 Other specified personal risk factors, not elsewhere classified: Secondary | ICD-10-CM | POA: Diagnosis not present

## 2017-09-23 DIAGNOSIS — I421 Obstructive hypertrophic cardiomyopathy: Secondary | ICD-10-CM | POA: Diagnosis not present

## 2017-09-26 DIAGNOSIS — E119 Type 2 diabetes mellitus without complications: Secondary | ICD-10-CM | POA: Diagnosis not present

## 2017-09-26 DIAGNOSIS — E782 Mixed hyperlipidemia: Secondary | ICD-10-CM | POA: Diagnosis not present

## 2017-09-26 DIAGNOSIS — Z6836 Body mass index (BMI) 36.0-36.9, adult: Secondary | ICD-10-CM | POA: Diagnosis not present

## 2017-09-26 DIAGNOSIS — Z0001 Encounter for general adult medical examination with abnormal findings: Secondary | ICD-10-CM | POA: Diagnosis not present

## 2017-09-26 DIAGNOSIS — Z9189 Other specified personal risk factors, not elsewhere classified: Secondary | ICD-10-CM | POA: Diagnosis not present

## 2017-09-26 DIAGNOSIS — I340 Nonrheumatic mitral (valve) insufficiency: Secondary | ICD-10-CM | POA: Diagnosis not present

## 2017-09-26 DIAGNOSIS — I10 Essential (primary) hypertension: Secondary | ICD-10-CM | POA: Diagnosis not present

## 2017-09-26 DIAGNOSIS — I5032 Chronic diastolic (congestive) heart failure: Secondary | ICD-10-CM | POA: Diagnosis not present

## 2017-09-26 DIAGNOSIS — Z23 Encounter for immunization: Secondary | ICD-10-CM | POA: Diagnosis not present

## 2017-09-26 DIAGNOSIS — I482 Chronic atrial fibrillation: Secondary | ICD-10-CM | POA: Diagnosis not present

## 2017-10-11 ENCOUNTER — Encounter: Payer: Self-pay | Admitting: *Deleted

## 2017-10-14 ENCOUNTER — Encounter: Payer: Self-pay | Admitting: Cardiovascular Disease

## 2017-10-14 ENCOUNTER — Ambulatory Visit (INDEPENDENT_AMBULATORY_CARE_PROVIDER_SITE_OTHER): Payer: Medicare Other | Admitting: Cardiovascular Disease

## 2017-10-14 VITALS — BP 98/60 | HR 44 | Ht 64.5 in | Wt 218.0 lb

## 2017-10-14 DIAGNOSIS — I10 Essential (primary) hypertension: Secondary | ICD-10-CM | POA: Diagnosis not present

## 2017-10-14 DIAGNOSIS — I482 Chronic atrial fibrillation: Secondary | ICD-10-CM

## 2017-10-14 DIAGNOSIS — I119 Hypertensive heart disease without heart failure: Secondary | ICD-10-CM

## 2017-10-14 DIAGNOSIS — I4821 Permanent atrial fibrillation: Secondary | ICD-10-CM

## 2017-10-14 DIAGNOSIS — I422 Other hypertrophic cardiomyopathy: Secondary | ICD-10-CM | POA: Diagnosis not present

## 2017-10-14 DIAGNOSIS — I2583 Coronary atherosclerosis due to lipid rich plaque: Secondary | ICD-10-CM | POA: Diagnosis not present

## 2017-10-14 DIAGNOSIS — I2510 Atherosclerotic heart disease of native coronary artery without angina pectoris: Secondary | ICD-10-CM | POA: Diagnosis not present

## 2017-10-14 DIAGNOSIS — I340 Nonrheumatic mitral (valve) insufficiency: Secondary | ICD-10-CM | POA: Diagnosis not present

## 2017-10-14 NOTE — Progress Notes (Signed)
SUBJECTIVE: The patient returns for routine follow-up. She has a history of hypertrophic/hypertensive cardiomyopathy. She developed newly diagnosed atrial fibrillation with a rapid ventricular response in December 2015 for which she successfully underwent TEE/DCCV on 07/22/2014.  She had chest pain and was hospitalized in December 2015 at which time she was in rapid atrial fibrillation. She was found to have a mildly elevated troponin.  Coronary angiography on 07/19/2014 showed mild diffuse nonobstructive coronary artery disease.  Most recent echocardiogram performed on 07/21/15 demonstrated normal left ventricular systolic function, EF 25-42% , mild concentric LVH, high filling pressures , and moderate mitral regurgitation.  ECG performed today shows atrial fibrillation, 49 bpm, with old anteroseptal infarct pattern.  The patient denies any symptoms of chest pain, palpitations, shortness of breath, lightheadedness, dizziness, leg swelling, orthopnea, PND, and syncope.  She checks her heart rate and blood pressure at home fairly routinely and heart rate is normally in the 60-65 bpm range with blood pressures ranging in the 100/60 range.   Review of Systems: As per "subjective", otherwise negative.  Allergies  Allergen Reactions  . Codeine     REACTION: hallucinate    Current Outpatient Medications  Medication Sig Dispense Refill  . atorvastatin (LIPITOR) 20 MG tablet TAKE 1 TABLET ONCE A DAY AT 6PM. 90 tablet 1  . Calcium Carb-Cholecalciferol (CALCIUM + D3) 600-200 MG-UNIT TABS Take 1 tablet by mouth daily.    . cholecalciferol (VITAMIN D) 1000 UNITS tablet Take 2,000 Units by mouth daily.     . Coenzyme Q10 50 MG CAPS Take 1 capsule by mouth daily.    Marland Kitchen ELIQUIS 5 MG TABS tablet TAKE (1) TABLET TWICE DAILY. 180 tablet 1  . Flaxseed, Linseed, (FLAXSEED OIL) 1000 MG CAPS Take 1 capsule by mouth 2 (two) times daily.    Marland Kitchen lisinopril-hydrochlorothiazide (PRINZIDE,ZESTORETIC)  20-12.5 MG tablet TAKE 2 TABLETS BY MOUTH DAILY. 180 tablet 1  . metoprolol (LOPRESSOR) 100 MG tablet Take 0.5 tablets (50 mg total) by mouth 2 (two) times daily. 90 tablet 3  . verapamil (CALAN-SR) 180 MG CR tablet Take 1 tablet (180 mg total) by mouth daily. 90 tablet 1   No current facility-administered medications for this visit.     Past Medical History:  Diagnosis Date  . [name] (coronary artery disease) 2008   mild  . Hypertensive cardiovascular disease   . Left atrial dilatation 2013  . Obesity   . Persistent atrial fibrillation [facility])     Past Surgical History:  Procedure Laterality Date  . ABDOMINAL HYSTERECTOMY    . ABDOMINAL HYSTERECTOMY    . CARDIOVERSION N/A 07/22/2014   Procedure: CARDIOVERSION;  Surgeon: Sueanne Margarita, MD;  Location: [place] ENDOSCOPY;  Service: Cardiovascular;  Laterality: N/A;  . LEFT HEART CATHETERIZATION WITH CORONARY ANGIOGRAM N/A 07/19/2014   Procedure: LEFT HEART CATHETERIZATION WITH CORONARY ANGIOGRAM;  Surgeon: Blane Ohara, MD;  Location: [facility] CATH LAB;  Service: Cardiovascular;  Laterality: N/A;  . TEE WITHOUT CARDIOVERSION N/A 07/22/2014   Procedure: TRANSESOPHAGEAL ECHOCARDIOGRAM (TEE);  Surgeon: Sueanne Margarita, MD;  Location: [facility] ENDOSCOPY;  Service: Cardiovascular;  Laterality: N/A;    Social History   Socioeconomic History  . Marital status: Married    Spouse name: Not on file  . Number of children: Not on file  . Years of education: Not on file  . Highest education level: Not on file  Social Needs  . Financial resource strain: Not on file  . Food insecurity - worry: Not on  file  . Food insecurity - inability: Not on file  . Transportation needs - medical: Not on file  . Transportation needs - non-medical: Not on file  Occupational History  . Occupation: Retired  Tobacco Use  . Smoking status: Never Smoker  . Smokeless tobacco: Never Used  Substance and Sexual Activity  . Alcohol use: No    Alcohol/week: 0.0 oz  . Drug use:  No  . Sexual activity: Not on file  Other Topics Concern  . Not on file  Social History Narrative   Lives in [place]   Retired     Vitals:   10/14/17 1143  BP: 98/60  Pulse: (!) 44  SpO2: 96%  Weight: 218 lb (98.9 kg)  Height: 5' 4.5" (1.638 m)    Wt Readings from Last 3 Encounters:  10/14/17 218 lb (98.9 kg)  10/08/16 233 lb (105.7 kg)  10/05/15 213 lb (96.6 kg)     PHYSICAL EXAM General: NAD HEENT: Normal. Neck: No JVD, no thyromegaly. Lungs: Clear to auscultation bilaterally with normal respiratory effort. CV: Regular rate and irregular rhythm, normal S1/S2, no S3, II/VI apical holosystolic murmur. No pretibial or periankle edema.  No carotid bruit.   Abdomen: Soft, nontender, no distention.  Neurologic: Alert and oriented.  Psych: Normal affect. Skin: Normal. Musculoskeletal: No gross deformities.    ECG: Most recent ECG reviewed.   Labs: Lab Results  Component Value Date/Time   K 4.5 07/19/2014 08:00 AM   BUN 8 07/19/2014 08:00 AM   CREATININE 0.49 (L) 07/19/2014 08:00 AM   TSH 2.660 07/19/2014 02:13 AM   HGB 12.8 07/19/2014 08:00 AM     Lipids: Lab Results  Component Value Date/Time   LDLCALC 67 07/19/2014 02:13 AM   CHOL 146 07/19/2014 02:13 AM   TRIG 234 (H) 07/19/2014 02:13 AM   HDL 32 (L) 07/19/2014 02:13 AM       ASSESSMENT AND PLAN: 1. Hypertrophic/hypertensive cardiomyopathy: Symptomatically stable.  No gradient on most recent echo, denies any current symptoms. Continue beta blocker therapy (metoprolol 50 mg bid).  2. Mitral regurgitation: Moderate as noted above. No current symptoms. Continue to follow clinically.  3. [name] (mild, nonobstructive): Continue [name] risk factor modification.  On statin therapy.  4. Permanent atrial fibrillation: Symptomatically stable. Continue metoprolol and verapamil for rate control. Continue Eliquis for stroke prevention. Was offered ablation and antiarrhythmic therapy by Dr. Rayann Heman but deferred.  5.  Essential HTN:  Blood pressure is low normal.  No changes.     Disposition: Follow up 1 year   [name], M.D., F.A.C.C.

## 2017-10-14 NOTE — Patient Instructions (Addendum)

## 2017-10-15 ENCOUNTER — Ambulatory Visit: Payer: Medicare Other | Admitting: Cardiovascular Disease

## 2017-10-22 ENCOUNTER — Other Ambulatory Visit: Payer: Self-pay | Admitting: Cardiovascular Disease

## 2017-10-22 DIAGNOSIS — H35033 Hypertensive retinopathy, bilateral: Secondary | ICD-10-CM | POA: Diagnosis not present

## 2017-11-03 ENCOUNTER — Other Ambulatory Visit: Payer: Self-pay | Admitting: Cardiovascular Disease

## 2017-11-05 DIAGNOSIS — M810 Age-related osteoporosis without current pathological fracture: Secondary | ICD-10-CM | POA: Diagnosis not present

## 2017-11-05 DIAGNOSIS — M85852 Other specified disorders of bone density and structure, left thigh: Secondary | ICD-10-CM | POA: Diagnosis not present

## 2017-11-11 ENCOUNTER — Other Ambulatory Visit: Payer: Self-pay | Admitting: Cardiovascular Disease

## 2017-11-27 DIAGNOSIS — H35372 Puckering of macula, left eye: Secondary | ICD-10-CM | POA: Diagnosis not present

## 2017-11-27 DIAGNOSIS — H43812 Vitreous degeneration, left eye: Secondary | ICD-10-CM | POA: Diagnosis not present

## 2017-11-27 DIAGNOSIS — H3582 Retinal ischemia: Secondary | ICD-10-CM | POA: Diagnosis not present

## 2017-11-27 DIAGNOSIS — H3581 Retinal edema: Secondary | ICD-10-CM | POA: Diagnosis not present

## 2017-11-29 DIAGNOSIS — K625 Hemorrhage of anus and rectum: Secondary | ICD-10-CM | POA: Diagnosis not present

## 2017-11-29 DIAGNOSIS — Z6837 Body mass index (BMI) 37.0-37.9, adult: Secondary | ICD-10-CM | POA: Diagnosis not present

## 2017-12-16 DIAGNOSIS — K5901 Slow transit constipation: Secondary | ICD-10-CM | POA: Diagnosis not present

## 2017-12-16 DIAGNOSIS — Z1212 Encounter for screening for malignant neoplasm of rectum: Secondary | ICD-10-CM | POA: Diagnosis not present

## 2017-12-16 DIAGNOSIS — K625 Hemorrhage of anus and rectum: Secondary | ICD-10-CM | POA: Diagnosis not present

## 2017-12-16 DIAGNOSIS — Z6837 Body mass index (BMI) 37.0-37.9, adult: Secondary | ICD-10-CM | POA: Diagnosis not present

## 2017-12-17 DIAGNOSIS — L570 Actinic keratosis: Secondary | ICD-10-CM | POA: Diagnosis not present

## 2017-12-17 DIAGNOSIS — L821 Other seborrheic keratosis: Secondary | ICD-10-CM | POA: Diagnosis not present

## 2017-12-17 DIAGNOSIS — L280 Lichen simplex chronicus: Secondary | ICD-10-CM | POA: Diagnosis not present

## 2017-12-19 DIAGNOSIS — K625 Hemorrhage of anus and rectum: Secondary | ICD-10-CM | POA: Diagnosis not present

## 2017-12-19 DIAGNOSIS — D529 Folate deficiency anemia, unspecified: Secondary | ICD-10-CM | POA: Diagnosis not present

## 2017-12-19 DIAGNOSIS — D519 Vitamin B12 deficiency anemia, unspecified: Secondary | ICD-10-CM | POA: Diagnosis not present

## 2017-12-29 ENCOUNTER — Other Ambulatory Visit: Payer: Self-pay | Admitting: Cardiovascular Disease

## 2017-12-31 ENCOUNTER — Other Ambulatory Visit: Payer: Self-pay | Admitting: Cardiovascular Disease

## 2017-12-31 ENCOUNTER — Encounter: Payer: Self-pay | Admitting: Internal Medicine

## 2018-01-06 DIAGNOSIS — I482 Chronic atrial fibrillation: Secondary | ICD-10-CM | POA: Diagnosis not present

## 2018-01-06 DIAGNOSIS — I6389 Other cerebral infarction: Secondary | ICD-10-CM | POA: Diagnosis not present

## 2018-01-06 DIAGNOSIS — E7800 Pure hypercholesterolemia, unspecified: Secondary | ICD-10-CM | POA: Diagnosis not present

## 2018-01-06 DIAGNOSIS — I442 Atrioventricular block, complete: Secondary | ICD-10-CM | POA: Diagnosis not present

## 2018-01-06 DIAGNOSIS — I6789 Other cerebrovascular disease: Secondary | ICD-10-CM | POA: Diagnosis not present

## 2018-01-06 DIAGNOSIS — R918 Other nonspecific abnormal finding of lung field: Secondary | ICD-10-CM | POA: Diagnosis not present

## 2018-01-06 DIAGNOSIS — I517 Cardiomegaly: Secondary | ICD-10-CM | POA: Diagnosis not present

## 2018-01-06 DIAGNOSIS — R9089 Other abnormal findings on diagnostic imaging of central nervous system: Secondary | ICD-10-CM | POA: Diagnosis not present

## 2018-01-06 DIAGNOSIS — I272 Pulmonary hypertension, unspecified: Secondary | ICD-10-CM | POA: Diagnosis not present

## 2018-01-06 DIAGNOSIS — I10 Essential (primary) hypertension: Secondary | ICD-10-CM | POA: Diagnosis present

## 2018-01-06 DIAGNOSIS — D649 Anemia, unspecified: Secondary | ICD-10-CM | POA: Diagnosis present

## 2018-01-06 DIAGNOSIS — I639 Cerebral infarction, unspecified: Secondary | ICD-10-CM | POA: Diagnosis not present

## 2018-01-06 DIAGNOSIS — R29701 NIHSS score 1: Secondary | ICD-10-CM | POA: Diagnosis not present

## 2018-01-06 DIAGNOSIS — G9389 Other specified disorders of brain: Secondary | ICD-10-CM | POA: Diagnosis not present

## 2018-01-06 DIAGNOSIS — R29700 NIHSS score 0: Secondary | ICD-10-CM | POA: Diagnosis present

## 2018-01-06 DIAGNOSIS — Z79899 Other long term (current) drug therapy: Secondary | ICD-10-CM | POA: Diagnosis not present

## 2018-01-06 DIAGNOSIS — R0602 Shortness of breath: Secondary | ICD-10-CM | POA: Diagnosis not present

## 2018-01-06 DIAGNOSIS — R4701 Aphasia: Secondary | ICD-10-CM | POA: Diagnosis present

## 2018-01-06 DIAGNOSIS — I519 Heart disease, unspecified: Secondary | ICD-10-CM | POA: Diagnosis not present

## 2018-01-06 DIAGNOSIS — E785 Hyperlipidemia, unspecified: Secondary | ICD-10-CM | POA: Diagnosis present

## 2018-01-06 DIAGNOSIS — I481 Persistent atrial fibrillation: Secondary | ICD-10-CM | POA: Diagnosis present

## 2018-01-06 DIAGNOSIS — R471 Dysarthria and anarthria: Secondary | ICD-10-CM | POA: Diagnosis not present

## 2018-01-06 DIAGNOSIS — I214 Non-ST elevation (NSTEMI) myocardial infarction: Secondary | ICD-10-CM | POA: Diagnosis not present

## 2018-01-06 DIAGNOSIS — Z9282 Status post administration of tPA (rtPA) in a different facility within the last 24 hours prior to admission to current facility: Secondary | ICD-10-CM | POA: Diagnosis not present

## 2018-01-06 DIAGNOSIS — E042 Nontoxic multinodular goiter: Secondary | ICD-10-CM | POA: Diagnosis not present

## 2018-01-06 DIAGNOSIS — I083 Combined rheumatic disorders of mitral, aortic and tricuspid valves: Secondary | ICD-10-CM | POA: Diagnosis not present

## 2018-01-06 DIAGNOSIS — E876 Hypokalemia: Secondary | ICD-10-CM | POA: Diagnosis present

## 2018-01-06 DIAGNOSIS — J9811 Atelectasis: Secondary | ICD-10-CM | POA: Diagnosis present

## 2018-01-06 DIAGNOSIS — R4182 Altered mental status, unspecified: Secondary | ICD-10-CM | POA: Diagnosis not present

## 2018-01-09 ENCOUNTER — Telehealth: Payer: Self-pay | Admitting: *Deleted

## 2018-01-09 DIAGNOSIS — I482 Chronic atrial fibrillation: Secondary | ICD-10-CM | POA: Diagnosis not present

## 2018-01-09 DIAGNOSIS — D649 Anemia, unspecified: Secondary | ICD-10-CM | POA: Diagnosis not present

## 2018-01-09 DIAGNOSIS — Z79899 Other long term (current) drug therapy: Secondary | ICD-10-CM | POA: Diagnosis not present

## 2018-01-09 DIAGNOSIS — Z8673 Personal history of transient ischemic attack (TIA), and cerebral infarction without residual deficits: Secondary | ICD-10-CM | POA: Diagnosis not present

## 2018-01-09 DIAGNOSIS — E785 Hyperlipidemia, unspecified: Secondary | ICD-10-CM | POA: Diagnosis not present

## 2018-01-09 DIAGNOSIS — I10 Essential (primary) hypertension: Secondary | ICD-10-CM | POA: Diagnosis not present

## 2018-01-09 MED ORDER — DOCUSATE SODIUM 100 MG PO CAPS
100.00 | ORAL_CAPSULE | ORAL | Status: DC
Start: 2018-01-08 — End: 2018-01-09

## 2018-01-09 MED ORDER — SODIUM CHLORIDE 0.9 % IV SOLN
10.00 | INTRAVENOUS | Status: DC
Start: ? — End: 2018-01-09

## 2018-01-09 MED ORDER — PRAVASTATIN SODIUM 20 MG PO TABS
20.00 | ORAL_TABLET | ORAL | Status: DC
Start: ? — End: 2018-01-09

## 2018-01-09 MED ORDER — HYDRALAZINE HCL 20 MG/ML IJ SOLN
10.00 | INTRAMUSCULAR | Status: DC
Start: ? — End: 2018-01-09

## 2018-01-09 MED ORDER — GENERIC EXTERNAL MEDICATION
75.00 | Status: DC
Start: ? — End: 2018-01-09

## 2018-01-09 MED ORDER — LABETALOL HCL 5 MG/ML IV SOLN
10.00 | INTRAVENOUS | Status: DC
Start: ? — End: 2018-01-09

## 2018-01-09 MED ORDER — HEPARIN SODIUM (PORCINE) 5000 UNIT/ML IJ SOLN
5000.00 | INTRAMUSCULAR | Status: DC
Start: 2018-01-08 — End: 2018-01-09

## 2018-01-09 MED ORDER — ONDANSETRON HCL 4 MG/2ML IJ SOLN
4.00 | INTRAMUSCULAR | Status: DC
Start: ? — End: 2018-01-09

## 2018-01-09 MED ORDER — ASPIRIN 325 MG PO TABS
325.00 | ORAL_TABLET | ORAL | Status: DC
Start: 2018-01-09 — End: 2018-01-09

## 2018-01-09 MED ORDER — GENERIC EXTERNAL MEDICATION
Status: DC
Start: ? — End: 2018-01-09

## 2018-01-09 NOTE — Telephone Encounter (Signed)
If HR in 150 bpm range and recently had a stroke and off of anticoagulation, would at least need an ECG. Reassuring that she is not experiencing any symptoms. Has seen EP in the past and deferred on antiarrhythmic therapy and/or ablation. This may need reconsideration. See if she can be evaluated in the a fib clinic tomorrow.  If she were to develop symptoms, I would have her go to the ED.

## 2018-01-09 NOTE — Telephone Encounter (Signed)
Pt home health nurse is out evaluating pt and describes irregular heart rhythm HR 150s BP 106/63 O2 85% - pt denies any SOB/dizziness/swelling/chest pain - was [facility] at [facility]A ([facility] records can be seen in pt chart) looks like they changed metoprolol 75 mg daily and pt had been holding Eliquis due to some bleeding - made pt an appt for tomorrow at 320 with Dr Bronson Ing - will confirm with provider if ok or does pt need to again be evaluated in ED

## 2018-01-09 NOTE — Telephone Encounter (Signed)
Pt aware and has appt tomorrow at 320

## 2018-01-09 NOTE — Telephone Encounter (Signed)
No provider in Afib clinic tomorrow (out of office) will not have coverage until Monday - should pt come in for EKG in office tomorrow?

## 2018-01-09 NOTE — Telephone Encounter (Signed)
Yes

## 2018-01-10 ENCOUNTER — Encounter: Payer: Self-pay | Admitting: Cardiovascular Disease

## 2018-01-10 ENCOUNTER — Ambulatory Visit (INDEPENDENT_AMBULATORY_CARE_PROVIDER_SITE_OTHER): Payer: Medicare Other | Admitting: Cardiovascular Disease

## 2018-01-10 VITALS — BP 150/62 | Ht 64.0 in | Wt 214.0 lb

## 2018-01-10 DIAGNOSIS — Z9289 Personal history of other medical treatment: Secondary | ICD-10-CM

## 2018-01-10 DIAGNOSIS — I119 Hypertensive heart disease without heart failure: Secondary | ICD-10-CM | POA: Diagnosis not present

## 2018-01-10 DIAGNOSIS — I4891 Unspecified atrial fibrillation: Secondary | ICD-10-CM

## 2018-01-10 DIAGNOSIS — I422 Other hypertrophic cardiomyopathy: Secondary | ICD-10-CM | POA: Diagnosis not present

## 2018-01-10 DIAGNOSIS — I639 Cerebral infarction, unspecified: Secondary | ICD-10-CM | POA: Diagnosis not present

## 2018-01-10 DIAGNOSIS — I340 Nonrheumatic mitral (valve) insufficiency: Secondary | ICD-10-CM | POA: Diagnosis not present

## 2018-01-10 MED ORDER — GENERIC EXTERNAL MEDICATION
Status: DC
Start: ? — End: 2018-01-10

## 2018-01-10 MED ORDER — METOPROLOL TARTRATE 25 MG PO TABS: 37.5000 mg | ORAL_TABLET | Freq: Two times a day (BID) | ORAL | 1 refills | Status: DC

## 2018-01-10 NOTE — Patient Instructions (Addendum)
Medication Instructions:   Your physician has recommended you make the following change in your medication:   Take metoprolol tartrate 37.5 mg by mouth twice daily.  As planned per [facility] discharge, restart eliquis on January 13, 2018.  Continue all other medications the same.  Labwork:  NONE  Testing/Procedures:  Please have an EKG done at Dr. Olena Heckle' office on Monday and he may adjust your metoprolol dose as needed.  Follow-Up:  Your physician recommends that you schedule a follow-up appointment in: 2 weeks with Bernerd Pho PA at our [place] office.  Any Other Special Instructions Will Be Listed Below (If Applicable).  If you need a refill on your cardiac medications before your next appointment, please call your pharmacy.

## 2018-01-10 NOTE — Progress Notes (Signed)
SUBJECTIVE: The patient presents for follow-up of atrial fibrillation and hypertrophic/hypertensive cardiomyopathy.  She had been taking metoprolol tartrate 50 mg twice daily and long-acting verapamil 180 mg daily.  She was offered atrial fibrillation ablation and antiarrhythmic therapy by Dr. Rayann Heman but she deferred.  She called our office yesterday.  Home health nurse described an irregular heart rhythm with a heart rate of 150 bpm, blood pressure 106/63, and oxygen saturations of 85%.  ECG performed today which I personally reviewed demonstrates rapid atrial fibrillation, 157 bpm, anteroseptal Q waves, and diffuse nonspecific ST segment and T wave abnormalities.  It appears she was hospitalized for an acute CVA at [facility] recently.  I reviewed electronic medical record.  A head CT performed at 01/06/2018 at [facility] reportedly demonstrated a small acute or early subacute cortical infarction in the superior left frontal lobe just anterior to the left precentral sulcus.  There were also some abnormalities in the left frontal lobe inferolateral to the other defect which was compatible with ischemia/infarction which may have been acute or subacute.  There was also a small acute or early subacute cortical infarction in the posterior medial, superior left occipital lobe.  An MRI demonstrated similar findings.  Subsequent neuroimaging after TPA demonstrated no evidence of intracranial hemorrhage.  The cortical area of infarct seen on previous MRI was not well visualized with no significant vasogenic edema.  She underwent an echocardiogram on 01/07/2018 which I reviewed which demonstrated normal left ventricular systolic function, LVEF 60 to 65%, mild concentric LVH with normal regional wall motion, grade 3/grade 4 diastolic dysfunction, moderate left atrial and mild right atrial dilatation, moderate tricuspid regurgitation, mild pulmonary hypertension, and mild aortic valve  sclerosis.  Relevant labs: Sodium 139, potassium 3.9, BUN 10, creatinine 0.6, hemoglobin 9, platelets 261, total cholesterol 74, triglycerides 63, HDL 31, LDL 30.   It appears she was discharged on metoprolol 75 mg daily.  It was noted that she should restart her Eliquis on 01/13/2018.  It was also noted that she would not require a repeat head CT prior to reinitiation.  She is here with her daughter.  Amazingly, the patient is relatively asymptomatic.  She denies chest pain.  She is on 2 L of oxygen.  She really does not describe palpitations.  She is working with physical therapy.  She is not taking any of her medications.  She has an appointment to see her PCP this upcoming Monday, 01/13/2018.  She tells me she had plan on waiting until then to find out what medications she should take.  She was unaware of her discharge summary.  Hemoglobin was low at 9 on 12/20/2017.  She was instructed by her PCP to stop Eliquis and she was referred to gastroenterology.  It appears she had not been taking Eliquis for about 10 to 14 days prior to her stroke.  She is no longer aphasic.  Her daughter tells me that oxygen levels dropped to the mid to upper 80% range off of oxygen.  Review of Systems: As per "subjective", otherwise negative.  Allergies  Allergen Reactions  . Codeine     REACTION: hallucinate    Current Outpatient Medications  Medication Sig Dispense Refill  . atorvastatin (LIPITOR) 20 MG tablet TAKE 1 TABLET ONCE A DAY AT 6PM. 90 tablet 3  . Calcium Carb-Cholecalciferol (CALCIUM + D3) 600-200 MG-UNIT TABS Take 1 tablet by mouth daily.    . cholecalciferol (VITAMIN D) 1000 UNITS tablet Take 2,000  Units by mouth daily.     . Coenzyme Q10 50 MG CAPS Take 1 capsule by mouth daily.    Marland Kitchen ELIQUIS 5 MG TABS tablet TAKE (1) TABLET TWICE DAILY. 60 tablet 6  . Flaxseed, Linseed, (FLAXSEED OIL) 1000 MG CAPS Take 1 capsule by mouth 2 (two) times daily.    Marland Kitchen lisinopril-hydrochlorothiazide  (PRINZIDE,ZESTORETIC) 20-12.5 MG tablet TAKE 2 TABLETS BY MOUTH DAILY. 180 tablet 0  . metoprolol tartrate (LOPRESSOR) 100 MG tablet TAKE (1/2) TABLET BY MOUTH 2 TIMES A DAY. 90 tablet 1  . metoprolol tartrate (LOPRESSOR) 100 MG tablet TAKE (1/2) TABLET BY MOUTH 2 TIMES A DAY. 90 tablet 1  . verapamil (CALAN-SR) 180 MG CR tablet Take 1 tablet (180 mg total) by mouth daily. 90 tablet 1   No current facility-administered medications for this visit.     Past Medical History:  Diagnosis Date  . [name] (coronary artery disease) 2008   mild  . Hypertensive cardiovascular disease   . Left atrial dilatation 2013  . Obesity   . Persistent atrial fibrillation [facility])     Past Surgical History:  Procedure Laterality Date  . ABDOMINAL HYSTERECTOMY    . ABDOMINAL HYSTERECTOMY    . CARDIOVERSION N/A 07/22/2014   Procedure: CARDIOVERSION;  Surgeon: Sueanne Margarita, MD;  Location: [place] ENDOSCOPY;  Service: Cardiovascular;  Laterality: N/A;  . LEFT HEART CATHETERIZATION WITH CORONARY ANGIOGRAM N/A 07/19/2014   Procedure: LEFT HEART CATHETERIZATION WITH CORONARY ANGIOGRAM;  Surgeon: Blane Ohara, MD;  Location: [facility] CATH LAB;  Service: Cardiovascular;  Laterality: N/A;  . TEE WITHOUT CARDIOVERSION N/A 07/22/2014   Procedure: TRANSESOPHAGEAL ECHOCARDIOGRAM (TEE);  Surgeon: Sueanne Margarita, MD;  Location[facility] ENDOSCOPY;  Service: Cardiovascular;  Laterality: N/A;    Social History   Socioeconomic History  . Marital status: Married    Spouse name: Not on file  . Number of children: Not on file  . Years of education: Not on file  . Highest education level: Not on file  Occupational History  . Occupation: Retired  Scientific laboratory technician  . Financial resource strain: Not on file  . Food insecurity:    Worry: Not on file    Inability: Not on file  . Transportation needs:    Medical: Not on file    Non-medical: Not on file  Tobacco Use  . Smoking status: Never Smoker  . Smokeless tobacco: Never Used    Substance and Sexual Activity  . Alcohol use: No    Alcohol/week: 0.0 oz  . Drug use: No  . Sexual activity: Not on file  Lifestyle  . Physical activity:    Days per week: Not on file    Minutes per session: Not on file  . Stress: Not on file  Relationships  . Social connections:    Talks on phone: Not on file    Gets together: Not on file    Attends religious service: Not on file    Active member of club or organization: Not on file    Attends meetings of clubs or organizations: Not on file    Relationship status: Not on file  . Intimate partner violence:    Fear of current or ex partner: Not on file    Emotionally abused: Not on file    Physically abused: Not on file    Forced sexual activity: Not on file  Other Topics Concern  . Not on file  Social History Narrative   Lives in [place]   Retired  Vitals:   01/10/18 1521  BP: (!) 150/62  SpO2: 98%  Weight: 214 lb (97.1 kg)  Height: 5\' 4"  (1.626 m)    Wt Readings from Last 3 Encounters:  01/10/18 214 lb (97.1 kg)  10/14/17 218 lb (98.9 kg)  10/08/16 233 lb (105.7 kg)     PHYSICAL EXAM General: NAD, using oxygen HEENT: Normal. Neck: No JVD, no thyromegaly. Lungs: Clear to auscultation bilaterally with normal respiratory effort. CV: Tachycardic, irregular rhythm, normal S1/S2, no S3, no murmur. No pretibial or periankle edema.  Abdomen: Soft, nontender, no distention.  Neurologic: Alert and oriented.  Psych: Normal affect. Skin: Normal. Musculoskeletal: No gross deformities.    ECG: Most recent ECG reviewed.   Labs: Lab Results  Component Value Date/Time   K 4.5 07/19/2014 08:00 AM   BUN 8 07/19/2014 08:00 AM   CREATININE 0.49 (L) 07/19/2014 08:00 AM   TSH 2.660 07/19/2014 02:13 AM   HGB 12.8 07/19/2014 08:00 AM     Lipids: Lab Results  Component Value Date/Time   LDLCALC 67 07/19/2014 02:13 AM   CHOL 146 07/19/2014 02:13 AM   TRIG 234 (H) 07/19/2014 02:13 AM   HDL 32 (L) 07/19/2014 02:13  AM       ASSESSMENT AND PLAN: 1.  Rapid atrial fibrillation: She is not taking any of her medications and told me she was waiting to see her PCP to find out what medication she should take.  She was unaware of her discharge summary.  I am having it reprinted so that she can see what medication she should be taking.  Eliquis is due to be restarted on 01/13/2018 given recent TPA for CVA. The only medication change I am making is switching metoprolol tartrate to 37.5 mg twice daily.  When she sees her PCP on 01/13/2018, and ECG should be obtained and metoprolol can be increased as needed allowing for permissive hypertension given recent CVA.  2.  CVA: She received TPA.  Details are noted above.  She has permissive hypertension.  Anticoagulation is to be restarted on 01/13/2018 as noted above.  She should be resumed on statin therapy.  3.  Hypertrophic/hypertensive cardiomyopathy: Most recent echocardiogram reviewed above.  4.  Hypertension: She has permissive hypertension given recent CVA.  5. [name] (mild, nonobstructive): Continue [name] risk factor modification.    She should resume statin therapy.    Disposition: Follow up 2 weeks  Time spent: 40 minutes, of which greater than 50% was spent reviewing symptoms, relevant blood tests and studies, and discussing management plan with the patient.    [name], M.D., F.A.C.C.

## 2018-01-13 DIAGNOSIS — D529 Folate deficiency anemia, unspecified: Secondary | ICD-10-CM | POA: Diagnosis not present

## 2018-01-13 DIAGNOSIS — I10 Essential (primary) hypertension: Secondary | ICD-10-CM | POA: Diagnosis not present

## 2018-01-13 DIAGNOSIS — E782 Mixed hyperlipidemia: Secondary | ICD-10-CM | POA: Diagnosis not present

## 2018-01-13 DIAGNOSIS — E119 Type 2 diabetes mellitus without complications: Secondary | ICD-10-CM | POA: Diagnosis not present

## 2018-01-13 DIAGNOSIS — Z6836 Body mass index (BMI) 36.0-36.9, adult: Secondary | ICD-10-CM | POA: Diagnosis not present

## 2018-01-13 DIAGNOSIS — E039 Hypothyroidism, unspecified: Secondary | ICD-10-CM | POA: Diagnosis not present

## 2018-01-13 DIAGNOSIS — E785 Hyperlipidemia, unspecified: Secondary | ICD-10-CM | POA: Diagnosis not present

## 2018-01-13 DIAGNOSIS — Z9189 Other specified personal risk factors, not elsewhere classified: Secondary | ICD-10-CM | POA: Diagnosis not present

## 2018-01-13 DIAGNOSIS — D509 Iron deficiency anemia, unspecified: Secondary | ICD-10-CM | POA: Diagnosis not present

## 2018-01-13 DIAGNOSIS — Z79899 Other long term (current) drug therapy: Secondary | ICD-10-CM | POA: Diagnosis not present

## 2018-01-13 DIAGNOSIS — I482 Chronic atrial fibrillation: Secondary | ICD-10-CM | POA: Diagnosis not present

## 2018-01-13 DIAGNOSIS — I5032 Chronic diastolic (congestive) heart failure: Secondary | ICD-10-CM | POA: Diagnosis not present

## 2018-01-13 DIAGNOSIS — D649 Anemia, unspecified: Secondary | ICD-10-CM | POA: Diagnosis not present

## 2018-01-13 DIAGNOSIS — D519 Vitamin B12 deficiency anemia, unspecified: Secondary | ICD-10-CM | POA: Diagnosis not present

## 2018-01-13 DIAGNOSIS — Z8673 Personal history of transient ischemic attack (TIA), and cerebral infarction without residual deficits: Secondary | ICD-10-CM | POA: Diagnosis not present

## 2018-01-14 DIAGNOSIS — I10 Essential (primary) hypertension: Secondary | ICD-10-CM | POA: Diagnosis not present

## 2018-01-14 DIAGNOSIS — I481 Persistent atrial fibrillation: Secondary | ICD-10-CM | POA: Diagnosis not present

## 2018-01-14 DIAGNOSIS — E7800 Pure hypercholesterolemia, unspecified: Secondary | ICD-10-CM | POA: Diagnosis not present

## 2018-01-14 DIAGNOSIS — I639 Cerebral infarction, unspecified: Secondary | ICD-10-CM | POA: Diagnosis not present

## 2018-01-16 DIAGNOSIS — Z8673 Personal history of transient ischemic attack (TIA), and cerebral infarction without residual deficits: Secondary | ICD-10-CM | POA: Diagnosis not present

## 2018-01-16 DIAGNOSIS — D649 Anemia, unspecified: Secondary | ICD-10-CM | POA: Diagnosis not present

## 2018-01-16 DIAGNOSIS — E785 Hyperlipidemia, unspecified: Secondary | ICD-10-CM | POA: Diagnosis not present

## 2018-01-16 DIAGNOSIS — I482 Chronic atrial fibrillation: Secondary | ICD-10-CM | POA: Diagnosis not present

## 2018-01-16 DIAGNOSIS — I10 Essential (primary) hypertension: Secondary | ICD-10-CM | POA: Diagnosis not present

## 2018-01-16 DIAGNOSIS — Z79899 Other long term (current) drug therapy: Secondary | ICD-10-CM | POA: Diagnosis not present

## 2018-01-27 NOTE — Progress Notes (Signed)
Cardiology Office Note    Date:  01/28/2018   ID:  [name], [name], MRN [id number]  PCP:  Caryl Bis, MD  Cardiologist: [name], MD    Chief Complaint  Patient presents with  . Follow-up    2 week visit    History of Present Illness:    [name] is a 72 y.o. female with past medical history of persistent atrial fibrillation, hypertensive cardiomyopathy, HTN, HLD, and recent CVA who presents to the office today for 2-week follow-up.  She was last examined by Dr. Bronson Ing on 01/10/2018 and reported overall doing well at that time. She denied any recent chest pain or dyspnea on exertion. Interestingly enough, she was not taking any of her medications at the time of her visit and had been told to restart Eliquis on 01/13/2018 as previously outlined by her Neurologist. HR was noted to be elevated at the time of her visit and she was restarted on Metoprolol Tartrate 37.5 mg twice daily with plans to restart Eliquis in the coming days. Follow-up was arranged as she would likely require further titration of her AV nodal blocking agents (had previously been on Lopressor 50mg  BID and Verapamil 180mg  daily).   In talking with the patient and her daughter today, she reports overall doing well since her last office visit. She has continued to improve from her recent CVA and denies any residual deficits. No longer working with PT or OT. She denies any recent chest pain, palpitations, dyspnea on exertion, orthopnea, PND, or lower extremity edema.  She reports her heart rate has overall been well controlled in the 80s to 90s when checked at home.  She is using supplemental O2 as needed around the house but does not take this out when leaving. Oxygen saturations are at 91% on room air today.   Past Medical History:  Diagnosis Date  . [name] (coronary artery disease) 2008   mild  . Hypertensive cardiovascular disease   . Left atrial dilatation 2013  . Obesity     . Persistent atrial fibrillation ([place])   . Stroke (cerebrum) [facility])     Past Surgical History:  Procedure Laterality Date  . ABDOMINAL HYSTERECTOMY    . ABDOMINAL HYSTERECTOMY    . CARDIOVERSION N/A 07/22/2014   Procedure: CARDIOVERSION;  Surgeon: Sueanne Margarita, MD;  Location: [facility] ENDOSCOPY;  Service: Cardiovascular;  Laterality: N/A;  . LEFT HEART CATHETERIZATION WITH CORONARY ANGIOGRAM N/A 07/19/2014   Procedure: LEFT HEART CATHETERIZATION WITH CORONARY ANGIOGRAM;  Surgeon: Blane Ohara, MD;  Location: [facility] CATH LAB;  Service: Cardiovascular;  Laterality: N/A;  . TEE WITHOUT CARDIOVERSION N/A 07/22/2014   Procedure: TRANSESOPHAGEAL ECHOCARDIOGRAM (TEE);  Surgeon: Sueanne Margarita, MD;  Location: [facility] ENDOSCOPY;  Service: Cardiovascular;  Laterality: N/A;    Current Medications: Outpatient Medications Prior to Visit  Medication Sig Dispense Refill  . Ascorbic Acid (VITAMIN C) 1000 MG tablet Take 1,000 mg by mouth daily.    . Calcium Carb-Cholecalciferol (CALCIUM + D3) 600-200 MG-UNIT TABS Take 1 tablet by mouth daily.    . cholecalciferol (VITAMIN D) 1000 UNITS tablet Take 2,000 Units by mouth daily.     . Coenzyme Q10 50 MG CAPS Take 1 capsule by mouth daily.    Marland Kitchen ELIQUIS 5 MG TABS tablet TAKE (1) TABLET TWICE DAILY. 60 tablet 6  . Flaxseed, Linseed, (FLAXSEED OIL) 1000 MG CAPS Take 1 capsule by mouth 2 (two) times daily.    Marland Kitchen glucosamine-chondroitin 500-400 MG  tablet Take by mouth.    . metoprolol tartrate (LOPRESSOR) 25 MG tablet Take 1.5 tablets (37.5 mg total) by mouth 2 (two) times daily. 270 tablet 1  . pantoprazole (PROTONIX) 40 MG tablet     . pravastatin (PRAVACHOL) 20 MG tablet     . atorvastatin (LIPITOR) 20 MG tablet TAKE 1 TABLET ONCE A DAY AT 6PM. 90 tablet 3  . lisinopril-hydrochlorothiazide (PRINZIDE,ZESTORETIC) 20-12.5 MG tablet TAKE 2 TABLETS BY MOUTH DAILY. 180 tablet 0  . verapamil (CALAN-SR) 180 MG CR tablet Take 1 tablet (180 mg total) by mouth daily. 90 tablet 1    No facility-administered medications prior to visit.      Allergies:   Codeine   Social History   Socioeconomic History  . Marital status: Married    Spouse name: Not on file  . Number of children: Not on file  . Years of education: Not on file  . Highest education level: Not on file  Occupational History  . Occupation: Retired  Scientific laboratory technician  . Financial resource strain: Not on file  . Food insecurity:    Worry: Not on file    Inability: Not on file  . Transportation needs:    Medical: Not on file    Non-medical: Not on file  Tobacco Use  . Smoking status: Never Smoker  . Smokeless tobacco: Never Used  Substance and Sexual Activity  . Alcohol use: No    Alcohol/week: 0.0 oz  . Drug use: No  . Sexual activity: Not on file  Lifestyle  . Physical activity:    Days per week: Not on file    Minutes per session: Not on file  . Stress: Not on file  Relationships  . Social connections:    Talks on phone: Not on file    Gets together: Not on file    Attends religious service: Not on file    Active member of club or organization: Not on file    Attends meetings of clubs or organizations: Not on file    Relationship status: Not on file  Other Topics Concern  . Not on file  Social History Narrative   Lives in [place]   Retired     Family History:  The patient's family history includes Hypertension in her unknown relative.   Review of Systems:   Please see the history of present illness.     General:  No chills, fever, night sweats or weight changes.  Cardiovascular:  No chest pain, dyspnea on exertion, edema, orthopnea, palpitations, paroxysmal nocturnal dyspnea. Dermatological: No rash, lesions/masses Respiratory: No cough, dyspnea Urologic: No hematuria, dysuria Abdominal:   No nausea, vomiting, diarrhea, bright red blood per rectum, melena, or hematemesis Neurologic:  No visual changes, wkns, changes in mental status.  Denies any of the above symptoms.   All  other systems reviewed and are otherwise negative except as noted above.   Physical Exam:    VS:  BP 126/68 (BP Location: Right Arm)   Pulse 98   Ht 5\' 4"  (1.626 m)   Wt 213 lb (96.6 kg)   SpO2 91%   BMI 36.56 kg/m    General: Well developed, well nourished Caucasian female appearing in no acute distress. Head: Normocephalic, atraumatic, sclera non-icteric, no xanthomas, nares are without discharge.  Neck: No carotid bruits. JVD not elevated.  Lungs: Respirations regular and unlabored, without wheezes or rales.  Heart: Irregularly irregular. No S3 or S4.  No murmur, no rubs, or gallops appreciated. Abdomen:  Soft, non-tender, non-distended with normoactive bowel sounds. No hepatomegaly. No rebound/guarding. No obvious abdominal masses. Msk:  Strength and tone appear normal for age. No joint deformities or effusions. Extremities: No clubbing or cyanosis. No lower extremity edema.  Distal pedal pulses are 2+ bilaterally. Neuro: Alert and oriented X 3. Moves all extremities spontaneously. No focal deficits noted. Psych:  Responds to questions appropriately with a normal affect. Skin: No rashes or lesions noted  Wt Readings from Last 3 Encounters:  01/28/18 213 lb (96.6 kg)  01/10/18 214 lb (97.1 kg)  10/14/17 218 lb (98.9 kg)     Studies/Labs Reviewed:   EKG:  EKG is not ordered today.   Recent Labs: No results found for requested labs within last 8760 hours.   Lipid Panel    Component Value Date/Time   CHOL 146 07/19/2014 0213   TRIG 234 (H) 07/19/2014 0213   HDL 32 (L) 07/19/2014 0213   CHOLHDL 4.6 07/19/2014 0213   VLDL 47 (H) 07/19/2014 0213   LDLCALC 67 07/19/2014 0213    Additional studies/ records that were reviewed today include:   Echocardiogram: 07/2015 Study Conclusions  - Left ventricle: The cavity size was normal. There was mild   concentric hypertrophy. Systolic function was normal. The   estimated ejection fraction was in the range of 60% to 65%.  Wall   motion was normal; there were no regional wall motion   abnormalities. The study was not technically sufficient to allow   evaluation of LV diastolic dysfunction due to atrial   fibrillation. Doppler parameters are consistent with high   ventricular filling pressure. - Aortic valve: Mildly to moderately calcified annulus. Mildly   thickened leaflets. - Mitral valve: Moderately to severely calcified annulus. There was   moderate regurgitation. - Left atrium: The atrium was mildly to moderately dilated. - Tricuspid valve: There was mild regurgitation.  Echocardiogram: 01/2018  The left ventricle is normal in size. There is mild concentric left ventricular hypertrophy with normal wall motion and ejection fraction 60-65%. There is severe (Grade III/IV) diastolic dysfunction; reversible restrictive pattern of mitral inflow. The right ventricle is grossly normal in size and function. The left atrium is moderately dilated. The right atrium is mildly dilated. Injection of contrast documented no interatrial shunt . There is moderate (2+) tricuspid regurgitation. Right ventricular systolic pressure is elevated at 47 mmHg, with mild pulmonary hypertension. Mild aortic sclerosis is present with good valvular opening. There is no pericardial effusion.  Assessment:    1. Permanent atrial fibrillation ([place])   2. Current use of long term anticoagulation   3. Hypertensive heart disease without heart failure   4. Essential hypertension, benign   5. Hyperlipidemia LDL goal <70   6. Moderate mitral regurgitation   7. History of CVA (cerebrovascular accident)      Plan:   In order of problems listed above:  1. Permanent Atrial Fibrillation/ Use of Long-Term Anticoagulation - In the setting of her recent CVA, her Verapamil and Metoprolol had been discontinued but Metoprolol Tartrate was restarted at a lower dose of 37.5 mg twice daily at the time of her office visit 3 weeks ago in the  setting of her elevated HR. She has overall done well since then and says heart rate has been well controlled in the 80's to 90's when checked at home. Heart rate is in the 90's during today's visit. Given that her blood pressure is relatively stable at 126/68 and that she is less than 3 weeks out  from her recent CVA, would not further titrate Metoprolol at this time. Would consider further titration of this to 50 mg twice daily prior to reinitiation of Verapamil if additional rate-control is needed in the future. Encouraged her to continue to follow HR and BP in the ambulatory setting. - She denies any evidence of active bleeding. Remains on Eliquis for anticoagulation.  2. Hypertensive Cardiomyopathy - echo in 01/2018 showed a preserved EF of 60-65% with Grade 3-4 DD.  - She denies any recent dyspnea on exertion, orthopnea, PND, or lower extremity edema. Does not appear volume overloaded by physical examination. Remains on Lasix 20 mg daily.  3. HTN - BP is well controlled at 126/68 during today's visit. Trying to avoid episodes of hypotension given her recent CVA. Continue Metoprolol at current dosing as outlined above.   4. HLD - She is listed as being on both Pravastatin and Atorvastatin at this time but confirms she is only taking Pravastatin.  - followed by PCP. Goal LDL is < 70 in the setting of her recent CVA.   5. Mitral Regurgitation - Moderate by recent echocardiogram in 01/2018. Will continue to follow.  6. Recent CVA - received TPA at the time of her admission. Has overall progressed well with no residual deficits.    Medication Adjustments/Labs and Tests Ordered: Current medicines are reviewed at length with the patient today.  Concerns regarding medicines are outlined above.  Medication changes, Labs and Tests ordered today are listed in the Patient Instructions below. Patient Instructions  Medication Instructions:  Your physician recommends that you continue on your current  medications as directed. Please refer to the Current Medication list given to you today.  Labwork: NONE  Testing/Procedures: NONE  Follow-Up: Your physician wants you to follow-up in: [place]. Bronson Ing. You will receive a reminder letter in the mail two months in advance. If you don't receive a letter, please call our office to schedule the follow-up appointment.  Any Other Special Instructions Will Be Listed Below (If Applicable).  If you need a refill on your cardiac medications before your next appointment, please call your pharmacy.   Signed, Erma Heritage, PA-C  01/28/2018 5:03 PM    [place] [address] [place], [place] [address] Phone: [phone]

## 2018-01-28 ENCOUNTER — Ambulatory Visit (INDEPENDENT_AMBULATORY_CARE_PROVIDER_SITE_OTHER): Payer: Medicare Other | Admitting: Student

## 2018-01-28 ENCOUNTER — Encounter: Payer: Self-pay | Admitting: Student

## 2018-01-28 VITALS — BP 126/68 | HR 98 | Ht 64.0 in | Wt 213.0 lb

## 2018-01-28 DIAGNOSIS — E785 Hyperlipidemia, unspecified: Secondary | ICD-10-CM | POA: Diagnosis not present

## 2018-01-28 DIAGNOSIS — I119 Hypertensive heart disease without heart failure: Secondary | ICD-10-CM | POA: Diagnosis not present

## 2018-01-28 DIAGNOSIS — I4821 Permanent atrial fibrillation: Secondary | ICD-10-CM

## 2018-01-28 DIAGNOSIS — I340 Nonrheumatic mitral (valve) insufficiency: Secondary | ICD-10-CM | POA: Diagnosis not present

## 2018-01-28 DIAGNOSIS — I10 Essential (primary) hypertension: Secondary | ICD-10-CM

## 2018-01-28 DIAGNOSIS — I639 Cerebral infarction, unspecified: Secondary | ICD-10-CM

## 2018-01-28 DIAGNOSIS — I482 Chronic atrial fibrillation: Secondary | ICD-10-CM | POA: Diagnosis not present

## 2018-01-28 DIAGNOSIS — Z7901 Long term (current) use of anticoagulants: Secondary | ICD-10-CM | POA: Diagnosis not present

## 2018-01-28 DIAGNOSIS — Z8673 Personal history of transient ischemic attack (TIA), and cerebral infarction without residual deficits: Secondary | ICD-10-CM | POA: Diagnosis not present

## 2018-01-28 NOTE — Patient Instructions (Signed)
Medication Instructions:  Your physician recommends that you continue on your current medications as directed. Please refer to the Current Medication list given to you today.  Labwork: NONE  Testing/Procedures: NONE  Follow-Up: Your physician wants you to follow-up in: 4 MONTHS WITH DR. KONESWARAN You will receive a reminder letter in the mail two months in advance. If you don't receive a letter, please call our office to schedule the follow-up appointment.  Any Other Special Instructions Will Be Listed Below (If Applicable).  If you need a refill on your cardiac medications before your next appointment, please call your pharmacy. 

## 2018-02-07 DIAGNOSIS — K219 Gastro-esophageal reflux disease without esophagitis: Secondary | ICD-10-CM | POA: Diagnosis not present

## 2018-02-07 DIAGNOSIS — I482 Chronic atrial fibrillation: Secondary | ICD-10-CM | POA: Diagnosis not present

## 2018-02-07 DIAGNOSIS — D509 Iron deficiency anemia, unspecified: Secondary | ICD-10-CM | POA: Diagnosis not present

## 2018-02-07 DIAGNOSIS — Z6836 Body mass index (BMI) 36.0-36.9, adult: Secondary | ICD-10-CM | POA: Diagnosis not present

## 2018-02-07 DIAGNOSIS — J9621 Acute and chronic respiratory failure with hypoxia: Secondary | ICD-10-CM | POA: Diagnosis not present

## 2018-02-07 DIAGNOSIS — I5032 Chronic diastolic (congestive) heart failure: Secondary | ICD-10-CM | POA: Diagnosis not present

## 2018-02-10 ENCOUNTER — Ambulatory Visit: Payer: Medicare Other

## 2018-03-05 DIAGNOSIS — I340 Nonrheumatic mitral (valve) insufficiency: Secondary | ICD-10-CM | POA: Diagnosis not present

## 2018-03-05 DIAGNOSIS — I10 Essential (primary) hypertension: Secondary | ICD-10-CM | POA: Diagnosis not present

## 2018-03-05 DIAGNOSIS — I482 Chronic atrial fibrillation: Secondary | ICD-10-CM | POA: Diagnosis not present

## 2018-03-05 DIAGNOSIS — Z6835 Body mass index (BMI) 35.0-35.9, adult: Secondary | ICD-10-CM | POA: Diagnosis not present

## 2018-03-05 DIAGNOSIS — I5032 Chronic diastolic (congestive) heart failure: Secondary | ICD-10-CM | POA: Diagnosis not present

## 2018-03-05 DIAGNOSIS — E782 Mixed hyperlipidemia: Secondary | ICD-10-CM | POA: Diagnosis not present

## 2018-03-05 DIAGNOSIS — E119 Type 2 diabetes mellitus without complications: Secondary | ICD-10-CM | POA: Diagnosis not present

## 2018-03-17 ENCOUNTER — Other Ambulatory Visit: Payer: Self-pay | Admitting: Cardiovascular Disease

## 2018-03-17 ENCOUNTER — Ambulatory Visit
Admission: RE | Admit: 2018-03-17 | Discharge: 2018-03-17 | Disposition: A | Discharge status: Home / Self Care | Payer: Medicare Other | Source: Ambulatory Visit | Attending: Unknown Physician Specialty | Admitting: Unknown Physician Specialty

## 2018-03-17 DIAGNOSIS — Z1231 Encounter for screening mammogram for malignant neoplasm of breast: Secondary | ICD-10-CM | POA: Diagnosis not present

## 2018-03-26 ENCOUNTER — Ambulatory Visit (INDEPENDENT_AMBULATORY_CARE_PROVIDER_SITE_OTHER): Payer: Medicare Other | Admitting: Gastroenterology

## 2018-03-26 ENCOUNTER — Encounter: Payer: Self-pay | Admitting: Gastroenterology

## 2018-03-26 DIAGNOSIS — I639 Cerebral infarction, unspecified: Secondary | ICD-10-CM | POA: Diagnosis not present

## 2018-03-26 DIAGNOSIS — D509 Iron deficiency anemia, unspecified: Secondary | ICD-10-CM | POA: Insufficient documentation

## 2018-03-26 DIAGNOSIS — D508 Other iron deficiency anemias: Secondary | ICD-10-CM | POA: Diagnosis not present

## 2018-03-26 NOTE — Patient Instructions (Signed)
It was good to meet you today!  Please let me know if you change your mind or you note black,sticky, tarry stool or bright red blood.  Avoid any Ibuprofen, Advil, Aleve, Motrin, aspirin as you are doing.  We will see you back as needed!  It was a pleasure to see you today. I strive to create trusting relationships with patients to provide genuine, compassionate, and quality care. I value your feedback. If you receive a survey regarding your visit,  I greatly appreciate you taking time to fill this out.   Annitta Needs, PhD, ANP-BC [facility]

## 2018-03-26 NOTE — Progress Notes (Signed)
Primary Care Physician:  Caryl Bis, MD Primary Gastroenterologist:  Dr. Gala Romney   Chief Complaint  Patient presents with  . Abnormal Lab    iron def anemia  . Consult    TCS. reports last one was not too long ago    HPI:   [name] is a 72 y.o. female presenting today at the request of Dr. Quillian Quince secondary to [place]. She had been taken off Eliquis around May 2019 due to anemia, and then she had a stroke in June 2019, (small left frontal and left occipital infarcts). History of afib. She is now back on Eliquis.    On iron, with stool sometimes darker. Was having rectal bleeding around May 2019, small amounts. Completely resolved. No constipation or diarrhea. No upper GI symptoms. No dysphagia. No unexplained weight weight loss or lack of appetite.   Colonoscopy at [facility] but doesn't remember the year. No history of polyps. No family history of colorectal cancer that she is aware.   She does not want to pursue any endoscopic evaluations currently.   May 2019 Hgb 9.0, Hct 32.6, Ferritin 27. Outside additional labs in June 2019 with Hgb 9.5, Hct 34.7, Ferritin 34.   Past Medical History:  Diagnosis Date  . [name] (coronary artery disease) 2008   mild  . Hypertensive cardiovascular disease   . Left atrial dilatation 2013  . Obesity   . Persistent atrial fibrillation ([place])   . Stroke (cerebrum) [facility])     Past Surgical History:  Procedure Laterality Date  . ABDOMINAL HYSTERECTOMY    . ABDOMINAL HYSTERECTOMY    . CARDIOVERSION N/A 07/22/2014   Procedure: CARDIOVERSION;  Surgeon: Sueanne Margarita, MD;  Location: [facility] ENDOSCOPY;  Service: Cardiovascular;  Laterality: N/A;  . LEFT HEART CATHETERIZATION WITH CORONARY ANGIOGRAM N/A 07/19/2014   Procedure: LEFT HEART CATHETERIZATION WITH CORONARY ANGIOGRAM;  Surgeon: Blane Ohara, MD;  Location[facility] CATH LAB;  Service: Cardiovascular;  Laterality: N/A;  . TEE WITHOUT CARDIOVERSION N/A 07/22/2014   Procedure: TRANSESOPHAGEAL  ECHOCARDIOGRAM (TEE);  Surgeon: Sueanne Margarita, MD;  Location: [facility] ENDOSCOPY;  Service: Cardiovascular;  Laterality: N/A;    Current Outpatient Medications  Medication Sig Dispense Refill  . Ascorbic Acid (VITAMIN C) 1000 MG tablet Take 1,000 mg by mouth daily.    . Calcium Carb-Cholecalciferol (CALCIUM + D3) 600-200 MG-UNIT TABS Take 1 tablet by mouth daily.    . cholecalciferol (VITAMIN D) 1000 UNITS tablet Take 2,000 Units by mouth daily.     . Coenzyme Q10 50 MG CAPS Take 1 capsule by mouth daily.    Marland Kitchen ELIQUIS 5 MG TABS tablet TAKE (1) TABLET TWICE DAILY. 60 tablet 6  . furosemide (LASIX) 20 MG tablet Take 20 mg by mouth daily.    Marland Kitchen glucosamine-chondroitin 500-400 MG tablet Take 1 tablet by mouth daily.     . iron polysaccharides (NIFEREX) 150 MG capsule Take 150 mg by mouth 2 (two) times daily.    . metoprolol tartrate (LOPRESSOR) 25 MG tablet TAKE 1&1/2 TABLETS BY MOUTH TWICE DAILY. 90 tablet 6  . pantoprazole (PROTONIX) 40 MG tablet Take 40 mg by mouth daily.     . pravastatin (PRAVACHOL) 20 MG tablet Take 20 mg by mouth daily.      No current facility-administered medications for this visit.     Allergies as of 03/26/2018 - Review Complete 03/26/2018  Allergen Reaction Noted  . Codeine      Family History  Problem Relation Age of  Onset  . Hypertension Unknown   . Emphysema Mother        deceased age 65   . Colon cancer Neg Hx   . Colon polyps Neg Hx     Social History   Socioeconomic History  . Marital status: Married    Spouse name: Not on file  . Number of children: Not on file  . Years of education: Not on file  . Highest education level: Not on file  Occupational History  . Occupation: Retired  Scientific laboratory technician  . Financial resource strain: Not on file  . Food insecurity:    Worry: Not on file    Inability: Not on file  . Transportation needs:    Medical: Not on file    Non-medical: Not on file  Tobacco Use  . Smoking status: Never Smoker  . Smokeless  tobacco: Never Used  Substance and Sexual Activity  . Alcohol use: No    Alcohol/week: 0.0 standard drinks  . Drug use: No  . Sexual activity: Not on file  Lifestyle  . Physical activity:    Days per week: Not on file    Minutes per session: Not on file  . Stress: Not on file  Relationships  . Social connections:    Talks on phone: Not on file    Gets together: Not on file    Attends religious service: Not on file    Active member of club or organization: Not on file    Attends meetings of clubs or organizations: Not on file    Relationship status: Not on file  . Intimate partner violence:    Fear of current or ex partner: Not on file    Emotionally abused: Not on file    Physically abused: Not on file    Forced sexual activity: Not on file  Other Topics Concern  . Not on file  Social History Narrative   Lives in [place]   Retired    Review of Systems: Gen: Denies any fever, chills, fatigue, weight loss, lack of appetite.  CV: Denies chest pain, heart palpitations, peripheral edema, syncope.  Resp: Denies shortness of breath at rest or with exertion. Denies wheezing or cough.  GI: see HPI  GU : Denies urinary burning, urinary frequency, urinary hesitancy MS: Denies joint pain, muscle weakness, cramps, or limitation of movement.  Derm: Denies rash, itching, dry skin Psych: Denies depression, anxiety, memory loss, and confusion Heme: see HPI   Physical Exam: BP (!) 156/92   Pulse 88   Temp (!) 97 F (36.1 C) (Oral)   Ht 5\' 4"  (1.626 m)   Wt 208 lb (94.3 kg)   BMI 35.70 kg/m  General:   Alert and oriented. Pleasant and cooperative. Well-nourished and well-developed.  Head:  Normocephalic and atraumatic. Eyes:  Without icterus, sclera clear and conjunctiva pink.  Ears:  Normal auditory acuity. Nose:  No deformity, discharge,  or lesions. Mouth:  No deformity or lesions, oral mucosa pink.  Lungs:  Clear to auscultation bilaterally. No wheezes, rales, or rhonchi. No  distress.  Heart:  S1, S2 present, irregularly irregular  Abdomen:  +BS, soft, non-tender and non-distended. No HSM noted. No guarding or rebound. No masses appreciated.  Rectal:  Deferred  Msk:  Symmetrical without gross deformities. Normal posture. Extremities:  Without  edema. Neurologic:  Alert and  oriented x4 Psych:  Alert and cooperative. Normal mood and affect.

## 2018-03-31 NOTE — Assessment & Plan Note (Addendum)
Very pleasant 72 year old female found to have IDA, low-volume hematochezia in May 2019 in setting of Eliquis. Unfortunately had a stroke in June 2019 and now back on Eliquis. She is declining any endoscopic evaluations at this time. Most recent Hgb a month ago stable at 9.5, ferritin slightly improved to 34. She denies fatigue, shortness of breath, tolerating oral iron. As she does not want to pursue endoscopic evaluations, we will see her back as needed. She is to call if developing any overt GI bleeding or worsening of IDA. Continue to avoid NSAIDs otherwise.   Colonoscopy received dated July 2017 by DR. Demason: normal.

## 2018-03-31 NOTE — Progress Notes (Signed)
CC'D TO PCP °

## 2018-04-02 DIAGNOSIS — I10 Essential (primary) hypertension: Secondary | ICD-10-CM | POA: Diagnosis not present

## 2018-04-02 DIAGNOSIS — I482 Chronic atrial fibrillation: Secondary | ICD-10-CM | POA: Diagnosis not present

## 2018-04-02 DIAGNOSIS — K219 Gastro-esophageal reflux disease without esophagitis: Secondary | ICD-10-CM | POA: Diagnosis not present

## 2018-04-02 DIAGNOSIS — E782 Mixed hyperlipidemia: Secondary | ICD-10-CM | POA: Diagnosis not present

## 2018-04-21 DIAGNOSIS — H25812 Combined forms of age-related cataract, left eye: Secondary | ICD-10-CM | POA: Diagnosis not present

## 2018-04-21 DIAGNOSIS — H25813 Combined forms of age-related cataract, bilateral: Secondary | ICD-10-CM | POA: Diagnosis not present

## 2018-04-21 DIAGNOSIS — H52222 Regular astigmatism, left eye: Secondary | ICD-10-CM | POA: Diagnosis not present

## 2018-04-28 DIAGNOSIS — Z01419 Encounter for gynecological examination (general) (routine) without abnormal findings: Secondary | ICD-10-CM | POA: Diagnosis not present

## 2018-05-06 DIAGNOSIS — Z23 Encounter for immunization: Secondary | ICD-10-CM | POA: Diagnosis not present

## 2018-05-08 NOTE — Patient Instructions (Signed)
Your procedure is scheduled on:  05/19/2018               Report to [facility] at  9:30   AM.  Call this number if you have problems the morning of surgery: [phone]   Remember:   Do not eat or drink :After Midnight.    Take these medicines the morning of surgery with A SIP OF WATER: Metoprolol and Protonix           Do not wear jewelry, make-up or nail polish.  Do not wear lotions, powders, or perfumes. You may wear deodorant.  Do not bring valuables to the hospital.  Contacts, dentures or bridgework may not be worn into surgery.  Patients discharged the day of surgery will not be allowed to drive home.  Name and phone number of your driver:     Cataract Surgery  A cataract is a clouding of the lens of the eye. When a lens becomes cloudy, vision is reduced based on the degree and nature of the clouding. Surgery may be needed to improve vision. Surgery removes the cloudy lens and usually replaces it with a substitute lens (intraocular lens, IOL). LET YOUR EYE DOCTOR KNOW ABOUT:  Allergies to food or medicine.   Medicines taken including herbs, eyedrops, over-the-counter medicines, and creams.   Use of steroids (by mouth or creams).   Previous problems with anesthetics or numbing medicine.   History of bleeding problems or blood clots.   Previous surgery.   Other health problems, including diabetes and kidney problems.   Possibility of pregnancy, if this applies.  RISKS AND COMPLICATIONS  Infection.   Inflammation of the eyeball (endophthalmitis) that can spread to both eyes (sympathetic ophthalmia).   Poor wound healing.   If an IOL is inserted, it can later fall out of proper position. This is very uncommon.   Clouding of the part of your eye that holds an IOL in place. This is called an "after-cataract." These are uncommon, but easily treated.  BEFORE THE PROCEDURE  Do not eat or drink anything except small amounts of water for 8 to 12 before your surgery,  or as directed by your caregiver.   Unless you are told otherwise, continue any eyedrops you have been prescribed.   Talk to your primary caregiver about all other medicines that you take (both prescription and non-prescription). In some cases, you may need to stop or change medicines near the time of your surgery. This is most important if you are taking blood-thinning medicine.Do not stop medicines unless you are told to do so.   Arrange for someone to drive you to and from the procedure.   Do not put contact lenses in either eye on the day of your surgery.  PROCEDURE There is more than one method for safely removing a cataract. Your doctor can explain the differences and help determine which is best for you. Phacoemulsification surgery is the most common form of cataract surgery.  An injection is given behind the eye or eyedrops are given to make this a painless procedure.   A small cut (incision) is made on the edge of the clear, dome-shaped surface that covers the front of the eye (cornea).   A tiny probe is painlessly inserted into the eye. This device gives off ultrasound waves that soften and break up the cloudy center of the lens. This makes it easier for the cloudy lens to be removed by suction.   An IOL may  be implanted.   The normal lens of the eye is covered by a clear capsule. Part of that capsule is intentionally left in the eye to support the IOL.   Your surgeon may or may not use stitches to close the incision.  There are other forms of cataract surgery that require a larger incision and stiches to close the eye. This approach is taken in cases where the doctor feels that the cataract cannot be easily removed using phacoemulsification. AFTER THE PROCEDURE  When an IOL is implanted, it does not need care. It becomes a permanent part of your eye and cannot be seen or felt.   Your doctor will schedule follow-up exams to check on your progress.   Review your other  medicines with your doctor to see which can be resumed after surgery.   Use eyedrops or take medicine as prescribed by your doctor.  Document Released: 07/12/2011 Document Reviewed: 07/09/2011 [facility] Patient Information 2012 [place].  .Cataract Surgery Care After Refer to this sheet in the next few weeks. These instructions provide you with information on caring for yourself after your procedure. Your caregiver may also give you more specific instructions. Your treatment has been planned according to current medical practices, but problems sometimes occur. Call your caregiver if you have any problems or questions after your procedure.  HOME CARE INSTRUCTIONS   Avoid strenuous activities as directed by your caregiver.   Ask your caregiver when you can resume driving.   Use eyedrops or other medicines to help healing and control pressure inside your eye as directed by your caregiver.   Only take over-the-counter or prescription medicines for pain, discomfort, or fever as directed by your caregiver.   Do not to touch or rub your eyes.   You may be instructed to use a protective shield during the first few days and nights after surgery. If not, wear sunglasses to protect your eyes. This is to protect the eye from pressure or from being accidentally bumped.   Keep the area around your eye clean and dry. Avoid swimming or allowing water to hit you directly in the face while showering. Keep soap and shampoo out of your eyes.   Do not bend or lift heavy objects. Bending increases pressure in the eye. You can walk, climb stairs, and do light household chores.   Do not put a contact lens into the eye that had surgery until your caregiver says it is okay to do so.   Ask your doctor when you can return to work. This will depend on the kind of work that you do. If you work in a dusty environment, you may be advised to wear protective eyewear for a period of time.   Ask your caregiver when  it will be safe to engage in sexual activity.   Continue with your regular eye exams as directed by your caregiver.  What to expect:  It is normal to feel itching and mild discomfort for a few days after cataract surgery. Some fluid discharge is also common, and your eye may be sensitive to light and touch.   After 1 to 2 days, even moderate discomfort should disappear. In most cases, healing will take about 6 weeks.   If you received an intraocular lens (IOL), you may notice that colors are very bright or have a blue tinge. Also, if you have been in bright sunlight, everything may appear reddish for a few hours. If you see these color tinges, it is  because your lens is clear and no longer cloudy. Within a few months after receiving an IOL, these extra colors should go away. When you have healed, you will probably need new glasses.  SEEK MEDICAL CARE IF:   You have increased bruising around your eye.   You have discomfort not helped by medicine.  SEEK IMMEDIATE MEDICAL CARE IF:   You have a fever.   You have a worsening or sudden vision loss.   You have redness, swelling, or increasing pain in the eye.   You have a thick discharge from the eye that had surgery.  MAKE SURE YOU:  Understand these instructions.   Will watch your condition.   Will get help right away if you are not doing well or get worse.  Document Released: 02/09/2005 Document Revised: 07/12/2011 Document Reviewed: 03/16/2011 [facility] Patient Information 2012 [place].    Monitored Anesthesia Care  Monitored anesthesia care is an anesthesia service for a medical procedure. Anesthesia is the loss of the ability to feel pain. It is produced by medications called anesthetics. It may affect a small area of your body (local anesthesia), a large area of your body (regional anesthesia), or your entire body (general anesthesia). The need for monitored anesthesia care depends your procedure, your condition, and the  potential need for regional or general anesthesia. It is often provided during procedures where:   General anesthesia may be needed if there are complications. This is because you need special care when you are under general anesthesia.   You will be under local or regional anesthesia. This is so that you are able to have higher levels of anesthesia if needed.   You will receive calming medications (sedatives). This is especially the case if sedatives are given to put you in a semi-conscious state of relaxation (deep sedation). This is because the amount of sedative needed to produce this state can be hard to predict. Too much of a sedative can produce general anesthesia. Monitored anesthesia care is performed by one or more caregivers who have special training in all types of anesthesia. You will need to meet with these caregivers before your procedure. During this meeting, they will ask you about your medical history. They will also give you instructions to follow. (For example, you will need to stop eating and drinking before your procedure. You may also need to stop or change medications you are taking.) During your procedure, your caregivers will stay with you. They will:   Watch your condition. This includes watching you blood pressure, breathing, and level of pain.   Diagnose and treat problems that occur.   Give medications if they are needed. These may include calming medications (sedatives) and anesthetics.   Make sure you are comfortable.  Having monitored anesthesia care does not necessarily mean that you will be under anesthesia. It does mean that your caregivers will be able to manage anesthesia if you need it or if it occurs. It also means that you will be able to have a different type of anesthesia than you are having if you need it. When your procedure is complete, your caregivers will continue to watch your condition. They will make sure any medications wear off before you are  allowed to go home.  Document Released: 04/18/2005 Document Revised: 11/17/2012 Document Reviewed: 09/03/2012 [facility] Patient Information 2014 [place], [place].

## 2018-05-13 ENCOUNTER — Encounter (HOSPITAL_COMMUNITY): Payer: Self-pay

## 2018-05-13 ENCOUNTER — Encounter (HOSPITAL_COMMUNITY)
Admission: RE | Admit: 2018-05-13 | Discharge: 2018-05-13 | Disposition: A | Discharge status: Home / Self Care | Payer: Medicare Other | Source: Ambulatory Visit | Attending: Ophthalmology | Admitting: Ophthalmology

## 2018-05-13 ENCOUNTER — Other Ambulatory Visit: Payer: Self-pay

## 2018-05-13 DIAGNOSIS — Z01818 Encounter for other preprocedural examination: Secondary | ICD-10-CM | POA: Insufficient documentation

## 2018-05-13 DIAGNOSIS — H269 Unspecified cataract: Secondary | ICD-10-CM | POA: Insufficient documentation

## 2018-05-13 LAB — CBC WITH DIFFERENTIAL/PLATELET
ABS IMMATURE GRANULOCYTES: 0.02 10*3/uL (ref 0.00–0.07)
BASOS ABS: 0.1 10*3/uL (ref 0.0–0.1)
BASOS PCT: 1 %
EOS ABS: 0.1 10*3/uL (ref 0.0–0.5)
Eosinophils Relative: 2 %
HCT: 48.6 % — ABNORMAL HIGH (ref 36.0–46.0)
Hemoglobin: 15.6 g/dL — ABNORMAL HIGH (ref 12.0–15.0)
IMMATURE GRANULOCYTES: 0 %
LYMPHS ABS: 0.9 10*3/uL (ref 0.7–4.0)
Lymphocytes Relative: 15 %
MCH: 28.3 pg (ref 26.0–34.0)
MCHC: 32.1 g/dL (ref 30.0–36.0)
MCV: 88 fL (ref 80.0–100.0)
MONOS PCT: 9 %
Monocytes Absolute: 0.6 10*3/uL (ref 0.1–1.0)
NEUTROS ABS: 4.4 10*3/uL (ref 1.7–7.7)
NEUTROS PCT: 73 %
NRBC: 0 % (ref 0.0–0.2)
PLATELETS: 209 10*3/uL (ref 150–400)
RBC: 5.52 MIL/uL — ABNORMAL HIGH (ref 3.87–5.11)
RDW: 14.3 % (ref 11.5–15.5)
WBC: 6.1 10*3/uL (ref 4.0–10.5)

## 2018-05-13 LAB — BASIC METABOLIC PANEL
Anion gap: 12 (ref 5–15)
BUN: 7 mg/dL — ABNORMAL LOW (ref 8–23)
CALCIUM: 9.8 mg/dL (ref 8.9–10.3)
CO2: 27 mmol/L (ref 22–32)
CREATININE: 0.65 mg/dL (ref 0.44–1.00)
Chloride: 103 mmol/L (ref 98–111)
GFR calc Af Amer: >60 mL/min (ref >60)
GFR calc non Af Amer: >60 mL/min (ref >60)
GLUCOSE: 130 mg/dL — AB (ref 70–99)
Potassium: 3.2 mmol/L — ABNORMAL LOW (ref 3.5–5.1)
Sodium: 142 mmol/L (ref 135–145)

## 2018-05-19 ENCOUNTER — Encounter (HOSPITAL_COMMUNITY)
Admission: RE | Disposition: A | Discharge status: Home / Self Care | Payer: Self-pay | Source: Ambulatory Visit | Attending: Ophthalmology

## 2018-05-19 ENCOUNTER — Other Ambulatory Visit: Payer: Self-pay

## 2018-05-19 ENCOUNTER — Ambulatory Visit (HOSPITAL_COMMUNITY): Payer: Medicare Other | Admitting: Anesthesiology

## 2018-05-19 ENCOUNTER — Ambulatory Visit (HOSPITAL_COMMUNITY)
Admission: RE | Admit: 2018-05-19 | Discharge: 2018-05-19 | Disposition: A | Discharge status: Home / Self Care | Payer: Medicare Other | Source: Ambulatory Visit | Attending: Ophthalmology | Admitting: Ophthalmology

## 2018-05-19 ENCOUNTER — Encounter (HOSPITAL_COMMUNITY): Payer: Self-pay | Admitting: *Deleted

## 2018-05-19 DIAGNOSIS — H25812 Combined forms of age-related cataract, left eye: Secondary | ICD-10-CM | POA: Diagnosis not present

## 2018-05-19 DIAGNOSIS — I10 Essential (primary) hypertension: Secondary | ICD-10-CM | POA: Insufficient documentation

## 2018-05-19 DIAGNOSIS — H2512 Age-related nuclear cataract, left eye: Secondary | ICD-10-CM | POA: Diagnosis not present

## 2018-05-19 DIAGNOSIS — I4891 Unspecified atrial fibrillation: Secondary | ICD-10-CM | POA: Diagnosis not present

## 2018-05-19 DIAGNOSIS — Z8673 Personal history of transient ischemic attack (TIA), and cerebral infarction without residual deficits: Secondary | ICD-10-CM | POA: Diagnosis not present

## 2018-05-19 DIAGNOSIS — I2510 Atherosclerotic heart disease of native coronary artery without angina pectoris: Secondary | ICD-10-CM | POA: Insufficient documentation

## 2018-05-19 HISTORY — PX: CATARACT EXTRACTION W/PHACO: SHX586

## 2018-05-19 SURGERY — PHACOEMULSIFICATION, CATARACT, WITH IOL INSERTION
Anesthesia: Monitor Anesthesia Care | Site: Eye | Laterality: Left

## 2018-05-19 MED ORDER — NEOMYCIN-POLYMYXIN-DEXAMETH 3.5-10000-0.1 OP SUSP
OPHTHALMIC | Status: DC | PRN
  Administered 2018-05-19: 2 [drp] via OPHTHALMIC

## 2018-05-19 MED ORDER — EPINEPHRINE PF 1 MG/ML IJ SOLN
INTRAOCULAR | Status: DC | PRN
  Administered 2018-05-19: 500 mL

## 2018-05-19 MED ORDER — CYCLOPENTOLATE-PHENYLEPHRINE 0.2-1 % OP SOLN
1.0000 [drp] | OPHTHALMIC | Status: AC
  Administered 2018-05-19 (×3): 1 [drp] via OPHTHALMIC

## 2018-05-19 MED ORDER — LIDOCAINE HCL (PF) 1 % IJ SOLN
INTRAMUSCULAR | Status: DC | PRN
  Administered 2018-05-19: .6 mL

## 2018-05-19 MED ORDER — POVIDONE-IODINE 5 % OP SOLN
OPHTHALMIC | Status: DC | PRN
  Administered 2018-05-19: 1 via OPHTHALMIC

## 2018-05-19 MED ORDER — BSS IO SOLN
INTRAOCULAR | Status: DC | PRN
  Administered 2018-05-19: 15 mL via INTRAOCULAR

## 2018-05-19 MED ORDER — EPINEPHRINE PF 1 MG/ML IJ SOLN
INTRAMUSCULAR | Status: AC
  Filled 2018-05-19: qty 1

## 2018-05-19 MED ORDER — PROVISC 10 MG/ML IO SOLN
INTRAOCULAR | Status: DC | PRN
  Administered 2018-05-19: 0.85 mL via INTRAOCULAR

## 2018-05-19 MED ORDER — LIDOCAINE HCL 3.5 % OP GEL
1.0000 "application " | Freq: Once | OPHTHALMIC | Status: AC
  Administered 2018-05-19: 1 via OPHTHALMIC

## 2018-05-19 MED ORDER — MIDAZOLAM HCL 5 MG/5ML IJ SOLN
INTRAMUSCULAR | Status: DC | PRN
  Administered 2018-05-19 (×2): 1 mg via INTRAVENOUS

## 2018-05-19 MED ORDER — MIDAZOLAM HCL 2 MG/2ML IJ SOLN
INTRAMUSCULAR | Status: AC
  Filled 2018-05-19: qty 2

## 2018-05-19 MED ORDER — LACTATED RINGERS IV SOLN
INTRAVENOUS | Status: DC
  Administered 2018-05-19: 10:00:00 via INTRAVENOUS

## 2018-05-19 MED ORDER — TETRACAINE HCL 0.5 % OP SOLN
1.0000 [drp] | OPHTHALMIC | Status: AC
  Administered 2018-05-19 (×3): 1 [drp] via OPHTHALMIC

## 2018-05-19 MED ORDER — PHENYLEPHRINE HCL 2.5 % OP SOLN
1.0000 [drp] | OPHTHALMIC | Status: AC
  Administered 2018-05-19 (×3): 1 [drp] via OPHTHALMIC

## 2018-05-19 SURGICAL SUPPLY — 12 items
CLOTH BEACON ORANGE TIMEOUT ST (SAFETY) ×2 IMPLANT
EYE SHIELD UNIVERSAL CLEAR (GAUZE/BANDAGES/DRESSINGS) ×2 IMPLANT
GLOVE BIOGEL PI IND STRL 7.0 (GLOVE) IMPLANT
GLOVE BIOGEL PI INDICATOR 7.0 (GLOVE) ×4
LENS ALC ACRYL/TECN (Ophthalmic Related) ×2 IMPLANT
NDL HYPO 18GX1.5 BLUNT FILL (NEEDLE) IMPLANT
NEEDLE HYPO 18GX1.5 BLUNT FILL (NEEDLE) ×3 IMPLANT
PAD ARMBOARD 7.5X6 YLW CONV (MISCELLANEOUS) ×2 IMPLANT
SYRINGE LUER LOK 1CC (MISCELLANEOUS) ×2 IMPLANT
TAPE SURG TRANSPORE 1 IN (GAUZE/BANDAGES/DRESSINGS) IMPLANT
TAPE SURGICAL TRANSPORE 1 IN (GAUZE/BANDAGES/DRESSINGS) ×2
WATER STERILE IRR 250ML POUR (IV SOLUTION) ×2 IMPLANT

## 2018-05-19 NOTE — Op Note (Signed)
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  Assistants: None  Anesthesia: Topical with MAC  Indications: Painless, progressive loss of vision with compromise of daily activities.  Surgery: Cataract Extraction with Intraocular lens Implant Left Eye  Discription: The patient had dilating drops and viscous lidocaine placed into the Left eye in the pre-op holding area. After transfer to the operating room, a time out was performed. The patient was then prepped and draped. Beginning with a 35m blade a paracentesis port was made at the surgeon's 2 o'clock position. The anterior chamber was then filled with 1% non-preserved lidocaine. This was followed by filling the anterior chamber with Provisc.  A 2.438mkeratome blade was used to make a clear corneal incision at the temporal limbus.  A bent cystatome needle was used to create a continuous tear capsulotomy. Hydrodissection was performed with balanced salt solution on a Fine canula. The lens nucleus was then removed using the phacoemulsification handpiece. Residual cortex was removed with the I&A handpiece. The anterior chamber and capsular bag were refilled with Provisc. A posterior chamber intraocular lens was placed into the capsular bag with it's injector. The implant was positioned with the Kuglan hook. The Provisc was then removed from the anterior chamber and capsular bag with the I&A handpiece. Stromal hydration of the main incision and paracentesis port was performed with BSS on a Fine canula. The wounds were tested for leak which was negative. The patient tolerated the procedure well. There were no operative complications. The patient was then transferred to the recovery room in stable condition.  Complications: None  Specimen: None  EBL: None  Prosthetic device: J&J Technis, PCB00, power 16.5, SN  [id number]

## 2018-05-19 NOTE — H&P (Signed)
I have reviewed the H&P, the patient was re-examined, and I have identified no interval changes in medical condition and plan of care since the history and physical of record  

## 2018-05-19 NOTE — Anesthesia Preprocedure Evaluation (Signed)
Anesthesia Evaluation  Patient identified by MRN, date of birth, ID band Patient awake    Reviewed: Allergy & Precautions, H&P , NPO status , Patient's Chart, lab work & pertinent test results, reviewed documented beta blocker date and time   Airway Mallampati: II  TM Distance: >3 FB Neck ROM: full    Dental no notable dental hx.    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  CVA negative psych ROS   GI/Hepatic negative GI ROS, Neg liver ROS,   Endo/Other  negative endocrine ROS  Renal/GU negative Renal ROS  negative genitourinary   Musculoskeletal   Abdominal   Peds  Hematology  (+) Blood dyscrasia, anemia ,   Anesthesia Other Findings   Reproductive/Obstetrics negative OB ROS                             Anesthesia Physical Anesthesia Plan  ASA: III  Anesthesia Plan: MAC   Post-op Pain Management:    Induction:   PONV Risk Score and Plan:   Airway Management Planned:   Additional Equipment:   Intra-op Plan:   Post-operative Plan:   Informed Consent: I have reviewed the patients History and Physical, chart, labs and discussed the procedure including the risks, benefits and alternatives for the proposed anesthesia with the patient or authorized representative who has indicated his/her understanding and acceptance.     Plan Discussed with: CRNA  Anesthesia Plan Comments:         Anesthesia Quick Evaluation

## 2018-05-19 NOTE — Transfer of Care (Signed)
Immediate Anesthesia Transfer of Care Note  Patient: [name]  Procedure(s) Performed: CATARACT EXTRACTION PHACO AND INTRAOCULAR LENS PLACEMENT (IOC) (Left Eye)  Patient Location: Short Stay  Anesthesia Type:MAC  Level of Consciousness: awake  Airway & Oxygen Therapy: Patient Spontanous Breathing  Post-op Assessment: Report given to RN  Post vital signs: Reviewed  Last Vitals:  Vitals Value Taken Time  BP    Temp    Pulse    Resp    SpO2      Last Pain:  Vitals:   05/19/18 0959  TempSrc: Oral  PainSc: 0-No pain      Patients Stated Pain Goal: 6 (55/73/22 0254)  Complications: No apparent anesthesia complications

## 2018-05-19 NOTE — Discharge Instructions (Signed)

## 2018-05-19 NOTE — Anesthesia Postprocedure Evaluation (Signed)
Anesthesia Post Note  Patient: [name]  Procedure(s) Performed: CATARACT EXTRACTION PHACO AND INTRAOCULAR LENS PLACEMENT ([place]) (Left Eye)  Patient location during evaluation: Short Stay Anesthesia Type: MAC Level of consciousness: awake and alert and oriented Pain management: pain level controlled Vital Signs Assessment: post-procedure vital signs reviewed and stable Respiratory status: spontaneous breathing Cardiovascular status: stable Postop Assessment: no apparent nausea or vomiting Anesthetic complications: no     Last Vitals:  Vitals:   05/19/18 0959 05/19/18 1010  BP: (!) 180/93   Pulse: 92   Resp: 18 15  Temp: 36.8 C   SpO2: 91% 94%    Last Pain:  Vitals:   05/19/18 0959  TempSrc: Oral  PainSc: 0-No pain                 Nilaya Bouie

## 2018-05-20 ENCOUNTER — Encounter (HOSPITAL_COMMUNITY): Payer: Self-pay | Admitting: Ophthalmology

## 2018-05-21 ENCOUNTER — Encounter: Payer: Self-pay | Admitting: *Deleted

## 2018-05-22 ENCOUNTER — Ambulatory Visit (INDEPENDENT_AMBULATORY_CARE_PROVIDER_SITE_OTHER): Payer: Medicare Other | Admitting: Cardiovascular Disease

## 2018-05-22 ENCOUNTER — Encounter: Payer: Self-pay | Admitting: Cardiovascular Disease

## 2018-05-22 VITALS — BP 161/83 | HR 81 | Ht 64.0 in | Wt 204.4 lb

## 2018-05-22 DIAGNOSIS — I4821 Permanent atrial fibrillation: Secondary | ICD-10-CM

## 2018-05-22 DIAGNOSIS — E785 Hyperlipidemia, unspecified: Secondary | ICD-10-CM | POA: Diagnosis not present

## 2018-05-22 DIAGNOSIS — I639 Cerebral infarction, unspecified: Secondary | ICD-10-CM | POA: Diagnosis not present

## 2018-05-22 DIAGNOSIS — I10 Essential (primary) hypertension: Secondary | ICD-10-CM

## 2018-05-22 DIAGNOSIS — Z7901 Long term (current) use of anticoagulants: Secondary | ICD-10-CM | POA: Diagnosis not present

## 2018-05-22 DIAGNOSIS — I340 Nonrheumatic mitral (valve) insufficiency: Secondary | ICD-10-CM

## 2018-05-22 DIAGNOSIS — I119 Hypertensive heart disease without heart failure: Secondary | ICD-10-CM

## 2018-05-22 DIAGNOSIS — Z8673 Personal history of transient ischemic attack (TIA), and cerebral infarction without residual deficits: Secondary | ICD-10-CM | POA: Diagnosis not present

## 2018-05-22 NOTE — Patient Instructions (Addendum)
Medication Instructions:  Continue all current medications.  Labwork: none  Testing/Procedures: none  Follow-Up: Your physician wants you to follow up in: 6 months.  You will receive a reminder letter in the mail one-two months in advance.  If you don't receive a letter, please call our office to schedule the follow up appointment   Any Other Special Instructions Will Be Listed Below (If Applicable). Your physician has requested that you regularly monitor and record your blood pressure readings at home. Please take readings 3 x week for 1 month and return to office for MD review.    If you need a refill on your cardiac medications before your next appointment, please call your pharmacy.

## 2018-05-22 NOTE — Progress Notes (Signed)
SUBJECTIVE: The patient presents for follow-up of atrial fibrillation and hypertensive cardiomyopathy.  The patient denies any symptoms of chest pain, palpitations, shortness of breath, lightheadedness, dizziness, leg swelling, orthopnea, PND, and syncope.  She is not certain if her blood pressure cuff at home is accurate.  She seldom checks it.  Blood pressure is elevated in our office today, 161/83.    Review of Systems: As per "subjective", otherwise negative.  Allergies  Allergen Reactions  . Codeine Other (See Comments)    REACTION: Hallucinations    Current Outpatient Medications  Medication Sig Dispense Refill  . Ascorbic Acid (VITAMIN C) 1000 MG tablet Take 500 mg by mouth 2 (two) times daily.     . Calcium Carb-Cholecalciferol (CALCIUM + D3) 600-200 MG-UNIT TABS Take 1 tablet by mouth daily.    . cholecalciferol (VITAMIN D) 1000 UNITS tablet Take 2,000 Units by mouth daily.     . Coenzyme Q10 (COQ-10) 100 MG CAPS Take 1 capsule by mouth daily.    Marland Kitchen ELIQUIS 5 MG TABS tablet TAKE (1) TABLET TWICE DAILY. (Patient taking differently: Take 5 mg by mouth 2 (two) times daily. ) 60 tablet 6  . furosemide (LASIX) 20 MG tablet Take 20 mg by mouth daily.    Marland Kitchen glucosamine-chondroitin 500-400 MG tablet Take 1 tablet by mouth 2 (two) times daily.     . iron polysaccharides (NIFEREX) 150 MG capsule Take 150 mg by mouth 2 (two) times daily.    . metoprolol tartrate (LOPRESSOR) 25 MG tablet TAKE 1&1/2 TABLETS BY MOUTH TWICE DAILY. (Patient taking differently: Take 37.5 mg by mouth 2 (two) times daily. ) 90 tablet 6  . pantoprazole (PROTONIX) 40 MG tablet Take 40 mg by mouth daily.     . pravastatin (PRAVACHOL) 20 MG tablet Take 20 mg by mouth every evening.      No current facility-administered medications for this visit.     Past Medical History:  Diagnosis Date  . [name] (coronary artery disease) 2008   mild  . Dysrhythmia    AFib  . Hypertensive cardiovascular disease   .  Left atrial dilatation 2013  . Obesity   . Persistent atrial fibrillation   . 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 ABDOMINAL HYSTERECTOMY    . ABDOMINAL HYSTERECTOMY    . BIOPSY THYROID     x2  . CARDIOVERSION N/A 07/22/2014   Procedure: CARDIOVERSION;  Surgeon: Sueanne Margarita, MD;  [facility] ENDOSCOPY;  Service: Cardiovascular;  Laterality: N/A;  . CATARACT EXTRACTION W/PHACO Left 05/19/2018   Procedure: CATARACT EXTRACTION PHACO AND INTRAOCULAR LENS PLACEMENT ([place]);  Surgeon: Tonny Branch, MD;  Location: [facility] ORS;  Service: Ophthalmology;  Laterality: Left;  CDE: 12.99  . LEFT HEART CATHETERIZATION WITH CORONARY ANGIOGRAM N/A 07/19/2014   Procedure: LEFT HEART CATHETERIZATION WITH CORONARY ANGIOGRAM;  Surgeon: Blane Ohara, MD;  Location: [facility] CATH LAB;  Service: Cardiovascular;  Laterality: N/A;  . TEE WITHOUT CARDIOVERSION N/A 07/22/2014   Procedure: TRANSESOPHAGEAL ECHOCARDIOGRAM (TEE);  Surgeon: Sueanne Margarita, MD;  Location: [facility] ENDOSCOPY;  Service: Cardiovascular;  Laterality: N/A;  . THYROIDECTOMY, PARTIAL      Social History   Socioeconomic History  . Marital status: Married    Spouse name: Not on file  . Number of children: Not on file  . Years of education: Not on file  . Highest education level: Not on file  Occupational History  . Occupation: Retired  Scientific laboratory technician  . Financial resource strain: Not on file  . Food insecurity:    Worry: Not on file    Inability: Not on file  . Transportation needs:    Medical: Not on file    Non-medical: Not on file  Tobacco Use  . Smoking status: Never Smoker  . Smokeless tobacco: Never Used  Substance and Sexual Activity  . Alcohol use: No    Alcohol/week: 0.0 standard drinks  . Drug use: No  . Sexual activity: Yes    Birth control/protection: Surgical  Lifestyle  . Physical activity:    Days per week: Not on file    Minutes per session: Not on  file  . Stress: Not on file  Relationships  . Social connections:    Talks on phone: Not on file    Gets together: Not on file    Attends religious service: Not on file    Active member of club or organization: Not on file    Attends meetings of clubs or organizations: Not on file    Relationship status: Not on file  . Intimate partner violence:    Fear of current or ex partner: Not on file    Emotionally abused: Not on file    Physically abused: Not on file    Forced sexual activity: Not on file  Other Topics Concern  . Not on file  Social History Narrative   Lives in [place]   Retired     Vitals:   05/22/18 0815  BP: (!) 161/83  Pulse: 81  Weight: 204 lb 6.4 oz (92.7 kg)  Height: 5\' 4"  (1.626 m)    Wt Readings from Last 3 Encounters:  05/22/18 204 lb 6.4 oz (92.7 kg)  05/19/18 204 lb (92.5 kg)  05/13/18 204 lb (92.5 kg)     PHYSICAL EXAM General: NAD HEENT: Normal. Neck: No JVD, no thyromegaly. Lungs: Clear to auscultation bilaterally with normal respiratory effort. CV: Regular rate and irregular rhythm, normal S1/S2, no S3, no murmur. No pretibial or periankle edema.  No carotid bruit.   Abdomen: Soft, nontender, no distention.  Neurologic: Alert and oriented.  Psych: Normal affect. Skin: Normal. Musculoskeletal: No gross deformities.    ECG: Reviewed above under Subjective   Labs: Lab Results  Component Value Date/Time   K 3.2 (L) 05/13/2018 02:21 PM   BUN 7 (L) 05/13/2018 02:21 PM   CREATININE 0.65 05/13/2018 02:21 PM   TSH 2.660 07/19/2014 02:13 AM   HGB 15.6 (H) 05/13/2018 02:21 PM     Lipids: Lab Results  Component Value Date/Time   LDLCALC 67 07/19/2014 02:13 AM   CHOL 146 07/19/2014 02:13 AM   TRIG 234 (H) 07/19/2014 02:13 AM   HDL 32 (L) 07/19/2014 02:13 AM       ASSESSMENT AND PLAN:  1.  Permanent atrial fibrillation: Symptomatically stable.  Heart rate is controlled on Lopressor 37.5 mg twice daily.  Anticoagulated with Eliquis.   No changes.  2.  Hypertensive cardiomyopathy/chronic diastolic heart failure: Euvolemic on Lasix 20 mg daily.    3.  Hypertension: Blood pressure is elevated today. I have asked the patient to check blood pressure readings 3 times per week for a month, at different times throughout the day, in order to get a better approximation of mean BP values. These results will be provided to me at the end of that period so that I can determine if antihypertensive medication titration is indicated.  4.  Hyperlipidemia: Currently on pravastatin 20 mg.  5.  Mitral regurgitation: Moderate in severity.  I will monitor.   Disposition: Follow up 6 months   [name], M.D., F.A.C.C.

## 2018-05-25 DIAGNOSIS — H25811 Combined forms of age-related cataract, right eye: Secondary | ICD-10-CM | POA: Diagnosis not present

## 2018-05-28 ENCOUNTER — Encounter (HOSPITAL_COMMUNITY)
Admission: RE | Admit: 2018-05-28 | Discharge: 2018-05-28 | Disposition: A | Discharge status: Home / Self Care | Payer: Medicare Other | Source: Ambulatory Visit | Attending: Ophthalmology | Admitting: Ophthalmology

## 2018-05-28 ENCOUNTER — Encounter (HOSPITAL_COMMUNITY): Payer: Self-pay

## 2018-06-02 ENCOUNTER — Ambulatory Visit (HOSPITAL_COMMUNITY): Payer: Medicare Other | Admitting: Anesthesiology

## 2018-06-02 ENCOUNTER — Ambulatory Visit (HOSPITAL_COMMUNITY)
Admission: RE | Admit: 2018-06-02 | Discharge: 2018-06-02 | Disposition: A | Discharge status: Home / Self Care | Payer: Medicare Other | Source: Ambulatory Visit | Attending: Ophthalmology | Admitting: Ophthalmology

## 2018-06-02 ENCOUNTER — Encounter (HOSPITAL_COMMUNITY)
Admission: RE | Disposition: A | Discharge status: Home / Self Care | Payer: Self-pay | Source: Ambulatory Visit | Attending: Ophthalmology

## 2018-06-02 ENCOUNTER — Encounter (HOSPITAL_COMMUNITY): Payer: Self-pay | Admitting: Anesthesiology

## 2018-06-02 DIAGNOSIS — H25811 Combined forms of age-related cataract, right eye: Secondary | ICD-10-CM | POA: Insufficient documentation

## 2018-06-02 DIAGNOSIS — I10 Essential (primary) hypertension: Secondary | ICD-10-CM | POA: Diagnosis not present

## 2018-06-02 DIAGNOSIS — Z86718 Personal history of other venous thrombosis and embolism: Secondary | ICD-10-CM | POA: Insufficient documentation

## 2018-06-02 DIAGNOSIS — I4891 Unspecified atrial fibrillation: Secondary | ICD-10-CM | POA: Diagnosis not present

## 2018-06-02 DIAGNOSIS — I2510 Atherosclerotic heart disease of native coronary artery without angina pectoris: Secondary | ICD-10-CM | POA: Diagnosis not present

## 2018-06-02 DIAGNOSIS — H2511 Age-related nuclear cataract, right eye: Secondary | ICD-10-CM | POA: Diagnosis not present

## 2018-06-02 HISTORY — PX: CATARACT EXTRACTION W/PHACO: SHX586

## 2018-06-02 SURGERY — PHACOEMULSIFICATION, CATARACT, WITH IOL INSERTION
Anesthesia: Monitor Anesthesia Care | Site: Eye | Laterality: Right

## 2018-06-02 MED ORDER — CYCLOPENTOLATE-PHENYLEPHRINE 0.2-1 % OP SOLN
1.0000 [drp] | OPHTHALMIC | Status: AC
  Administered 2018-06-02 (×2): 1 [drp] via OPHTHALMIC

## 2018-06-02 MED ORDER — MIDAZOLAM HCL 2 MG/2ML IJ SOLN
INTRAMUSCULAR | Status: AC
  Filled 2018-06-02: qty 2

## 2018-06-02 MED ORDER — NEOMYCIN-POLYMYXIN-DEXAMETH 3.5-10000-0.1 OP SUSP
OPHTHALMIC | Status: DC | PRN
  Administered 2018-06-02: 1 [drp] via OPHTHALMIC

## 2018-06-02 MED ORDER — TETRACAINE HCL 0.5 % OP SOLN
1.0000 [drp] | OPHTHALMIC | Status: AC
  Administered 2018-06-02 (×3): 1 [drp] via OPHTHALMIC

## 2018-06-02 MED ORDER — LIDOCAINE HCL (PF) 1 % IJ SOLN
INTRAMUSCULAR | Status: DC | PRN
  Administered 2018-06-02: .5 mL

## 2018-06-02 MED ORDER — PROVISC 10 MG/ML IO SOLN
INTRAOCULAR | Status: DC | PRN
  Administered 2018-06-02: 0.85 mL via INTRAOCULAR

## 2018-06-02 MED ORDER — MIDAZOLAM HCL 5 MG/5ML IJ SOLN
INTRAMUSCULAR | Status: DC | PRN
  Administered 2018-06-02 (×2): 1 mg via INTRAVENOUS

## 2018-06-02 MED ORDER — LACTATED RINGERS IV SOLN
INTRAVENOUS | Status: DC
  Administered 2018-06-02: 09:00:00 via INTRAVENOUS

## 2018-06-02 MED ORDER — LIDOCAINE HCL 3.5 % OP GEL
1.0000 "application " | Freq: Once | OPHTHALMIC | Status: AC
  Administered 2018-06-02: 1 via OPHTHALMIC

## 2018-06-02 MED ORDER — EPINEPHRINE PF 1 MG/ML IJ SOLN
INTRAOCULAR | Status: DC | PRN
  Administered 2018-06-02: 500 mL

## 2018-06-02 MED ORDER — BSS IO SOLN
INTRAOCULAR | Status: DC | PRN
  Administered 2018-06-02: 15 mL

## 2018-06-02 MED ORDER — PHENYLEPHRINE HCL 2.5 % OP SOLN
1.0000 [drp] | OPHTHALMIC | Status: AC
  Administered 2018-06-02 (×2): 1 [drp] via OPHTHALMIC

## 2018-06-02 MED ORDER — POVIDONE-IODINE 5 % OP SOLN
OPHTHALMIC | Status: DC | PRN
  Administered 2018-06-02: 1 via OPHTHALMIC

## 2018-06-02 MED ORDER — EPINEPHRINE PF 1 MG/ML IJ SOLN
INTRAMUSCULAR | Status: AC
  Filled 2018-06-02: qty 1

## 2018-06-02 SURGICAL SUPPLY — 12 items
CLOTH BEACON ORANGE TIMEOUT ST (SAFETY) ×1 IMPLANT
EYE SHIELD UNIVERSAL CLEAR (GAUZE/BANDAGES/DRESSINGS) ×1 IMPLANT
GLOVE BIOGEL PI IND STRL 6.5 (GLOVE) IMPLANT
GLOVE BIOGEL PI IND STRL 7.0 (GLOVE) IMPLANT
GLOVE BIOGEL PI INDICATOR 6.5 (GLOVE) ×1
GLOVE BIOGEL PI INDICATOR 7.0 (GLOVE) ×1
PAD ARMBOARD 7.5X6 YLW CONV (MISCELLANEOUS) ×1 IMPLANT
SIGHTPATH CAT PROC W REG LENS (Ophthalmic Related) ×2 IMPLANT
SYRINGE LUER LOK 1CC (MISCELLANEOUS) ×1 IMPLANT
TAPE SURG TRANSPORE 1 IN (GAUZE/BANDAGES/DRESSINGS) IMPLANT
TAPE SURGICAL TRANSPORE 1 IN (GAUZE/BANDAGES/DRESSINGS) ×1
WATER STERILE IRR 250ML POUR (IV SOLUTION) ×1 IMPLANT

## 2018-06-02 NOTE — Op Note (Signed)
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  Assistants: None  Anesthesia: Topical with MAC  Indications: Painless, progressive loss of vision with compromise of daily activities.  Surgery: Cataract Extraction with Intraocular lens Implant Right Eye  Discription: The patient had dilating drops and viscous lidocaine placed into the Right eye in the pre-op holding area. After transfer to the operating room, a time out was performed. The patient was then prepped and draped. Beginning with a 57m blade a paracentesis port was made at the surgeon's 2 o'clock position. The anterior chamber was then filled with 1% non-preserved lidocaine. This was followed by filling the anterior chamber with Provisc.  A 2.437mkeratome blade was used to make a clear corneal incision at the temporal limbus.  A bent cystatome needle was used to create a continuous tear capsulotomy. Hydrodissection was performed with balanced salt solution on a Fine canula. The lens nucleus was then removed using the phacoemulsification handpiece. Residual cortex was removed with the I&A handpiece. The anterior chamber and capsular bag were refilled with Provisc. A posterior chamber intraocular lens was placed into the capsular bag with it's injector. The implant was positioned with the Kuglan hook. The Provisc was then removed from the anterior chamber and capsular bag with the I&A handpiece. Stromal hydration of the main incision and paracentesis port was performed with BSS on a Fine canula. The wounds were tested for leak which was negative. The patient tolerated the procedure well. There were no operative complications. The patient was then transferred to the recovery room in stable condition.  Complications: None  Specimen: None  EBL: None  Prosthetic device: J&J Technis, PCB00, power 18.0,  SN [id number]

## 2018-06-02 NOTE — Addendum Note (Signed)
Addendum  created 06/02/18 1037 by Vista Deck, CRNA   Sign clinical note, SmartForm saved

## 2018-06-02 NOTE — Anesthesia Postprocedure Evaluation (Addendum)
Anesthesia Post Note  Patient: [name]  Procedure(s) Performed: CATARACT EXTRACTION PHACO AND INTRAOCULAR LENS PLACEMENT RIGHT EYE CDE=7.18 (Right Eye)  Anesthesia Type: MAC Level of consciousness: awake and alert and oriented Pain management: pain level controlled Vital Signs Assessment: post-procedure vital signs reviewed and stable Respiratory status: spontaneous breathing Cardiovascular status: stable Postop Assessment: no apparent nausea or vomiting Anesthetic complications: no     Last Vitals:  Vitals:   06/02/18 0915 06/02/18 0930  BP:    Pulse:    Resp: 12 16  Temp:    SpO2: 92% 91%    Last Pain:  Vitals:   06/02/18 0853  TempSrc: Oral  PainSc: 0-No pain                 ADAMS, AMY A

## 2018-06-02 NOTE — Anesthesia Preprocedure Evaluation (Addendum)
Anesthesia Evaluation  Patient identified by MRN, date of birth, ID band Patient awake    Reviewed: Allergy & Precautions, NPO status , Patient's Chart, lab work & pertinent test results, reviewed documented beta blocker date and time   Airway Mallampati: II  TM Distance: >3 FB Neck ROM: Full    Dental no notable dental hx. (+) Teeth Intact   Pulmonary neg pulmonary ROS,    Pulmonary exam normal breath sounds clear to auscultation       Cardiovascular Exercise Tolerance: Good hypertension, Pt. on medications + [name]  Normal cardiovascular exam+ dysrhythmias Atrial Fibrillation I Rhythm:Irregular Rate:Abnormal  Has known Afib for years -states last CV was years ago ~4-5. 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 - denies any current meds - has had previous thyroid surgeries in past   Renal/GU negative Renal ROS  negative genitourinary   Musculoskeletal negative musculoskeletal ROS (+)   Abdominal   Peds negative pediatric ROS (+)  Hematology negative hematology ROS (+) anemia ,   Anesthesia Other Findings   Reproductive/Obstetrics negative OB ROS                             Anesthesia Physical Anesthesia Plan  ASA: III  Anesthesia Plan: MAC   Post-op Pain Management:    Induction: Intravenous  PONV Risk Score and Plan:   Airway Management Planned: Nasal Cannula and Simple Face Mask  Additional Equipment:   Intra-op Plan:   Post-operative Plan:   Informed Consent: I have reviewed the patients History and Physical, chart, labs and discussed the procedure including the risks, benefits and alternatives for the  proposed anesthesia with the patient or authorized representative who has indicated his/her understanding and acceptance.   Dental advisory given  Plan Discussed with: CRNA  Anesthesia Plan Comments:        Anesthesia Quick Evaluation

## 2018-06-02 NOTE — Transfer of Care (Addendum)
Immediate Anesthesia Transfer of Care Note  Patient: [name]  Procedure(s) Performed: CATARACT EXTRACTION PHACO AND INTRAOCULAR LENS PLACEMENT RIGHT EYE CDE=7.18 (Right Eye)  Patient Location: Short Stay  Anesthesia TYPE: MAC  Level of Consciousness: awake, alert , oriented and patient cooperative  Airway & Oxygen Therapy: Patient Spontanous Breathing  Post-op Assessment: Report given to RN and Post -op Vital signs reviewed and stable  Post vital signs: Reviewed and stable  Last Vitals:  Vitals Value Taken Time  BP    Temp    Pulse    Resp    SpO2      Last Pain:  Vitals:   06/02/18 0853  TempSrc: Oral  PainSc: 0-No pain         Complications: No apparent anesthesia complications

## 2018-06-02 NOTE — Anesthesia Procedure Notes (Signed)
Procedure Name: MAC Date/Time: 06/02/2018 9:36 AM Performed by: Andree Elk Amy A, CRNA Pre-anesthesia Checklist: Patient identified, Emergency Drugs available, Suction available, Patient being monitored and Timeout performed

## 2018-06-02 NOTE — H&P (Signed)
I have reviewed the H&P, the patient was re-examined, and I have identified no interval changes in medical condition and plan of care since the history and physical of record  

## 2018-06-03 ENCOUNTER — Encounter (HOSPITAL_COMMUNITY): Payer: Self-pay | Admitting: Ophthalmology

## 2018-06-03 ENCOUNTER — Ambulatory Visit: Payer: Medicare Other | Admitting: Cardiovascular Disease

## 2018-06-09 IMAGING — MG 2D DIGITAL SCREENING BILATERAL MAMMOGRAM WITH CAD AND ADJUNCT TO
8 of 12 series · 8 of 28 positions shown · non-contrast
Comparison: Previous exam(s).

ACR Breast Density Category a: The breast tissue is almost entirely
fatty.

CLINICAL DATA: Screening.

EXAM:
2D DIGITAL SCREENING BILATERAL MAMMOGRAM WITH [name] AND ADJUNCT TOMO

[R CC]
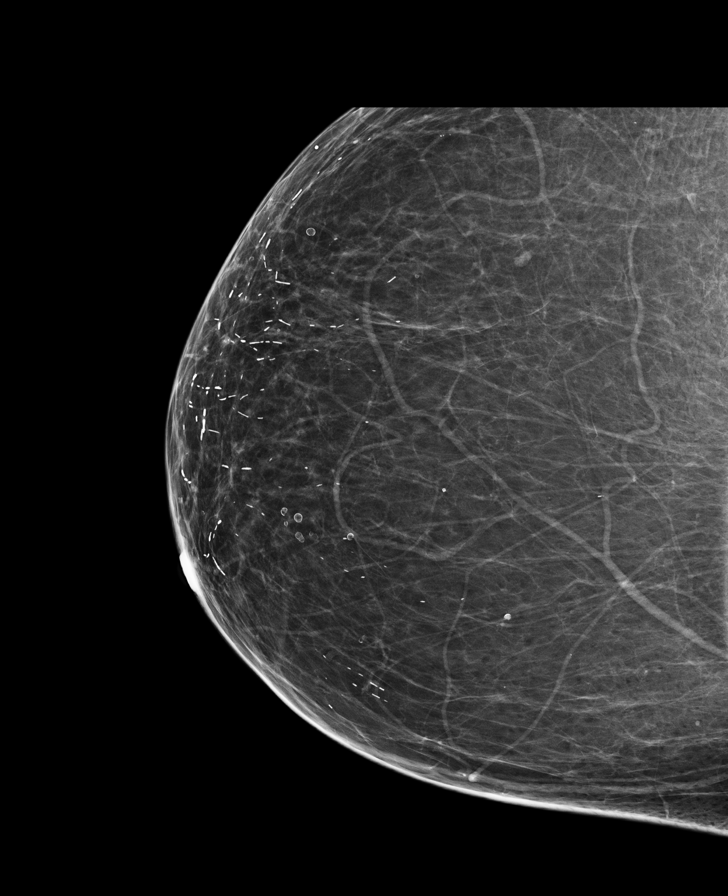

[L MLO synth-2D]
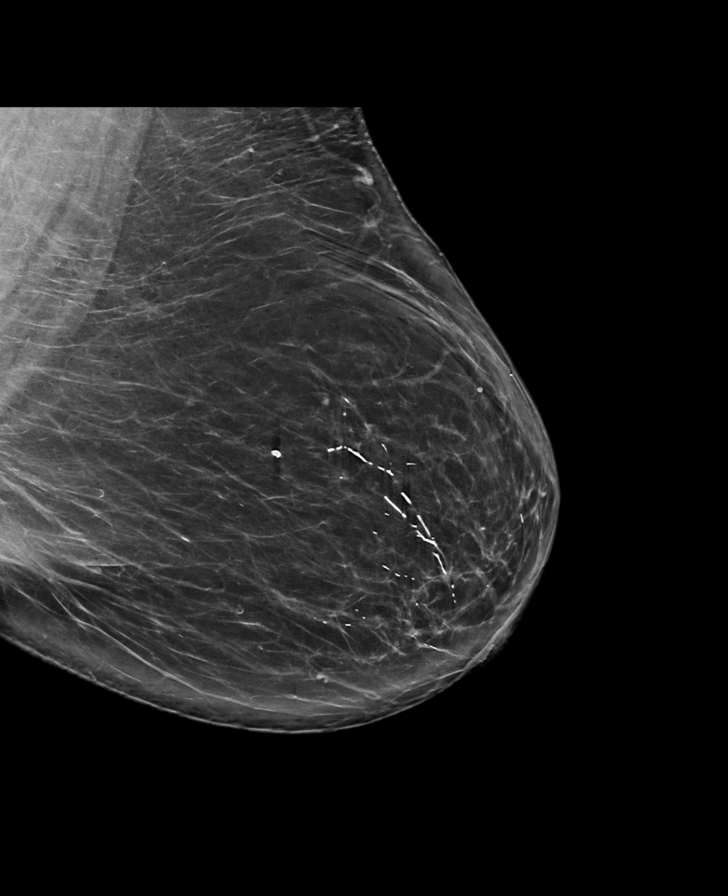

[L CC synth-2D]
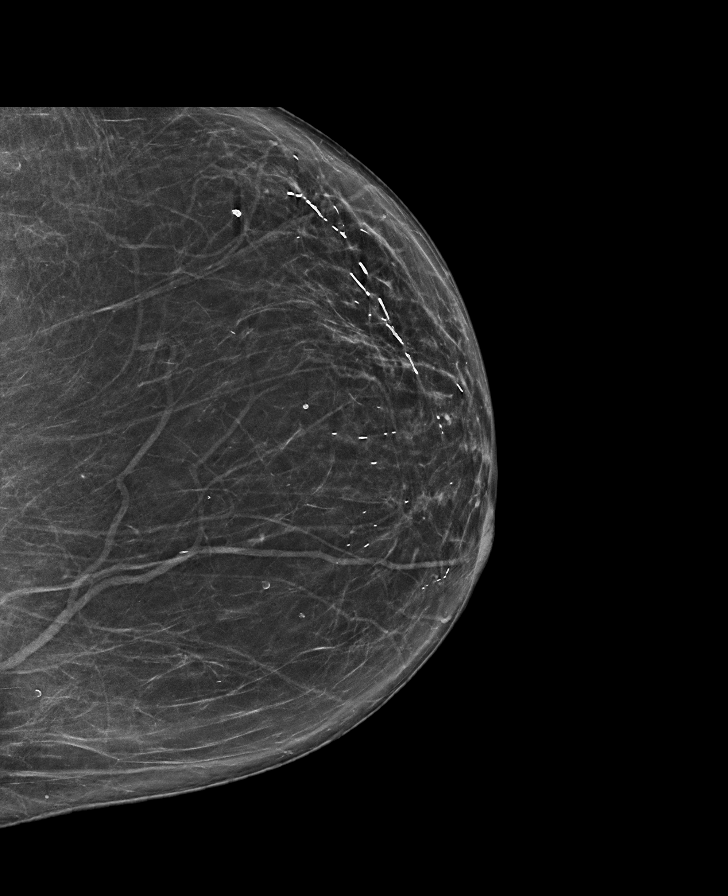

[R MLO synth-2D]
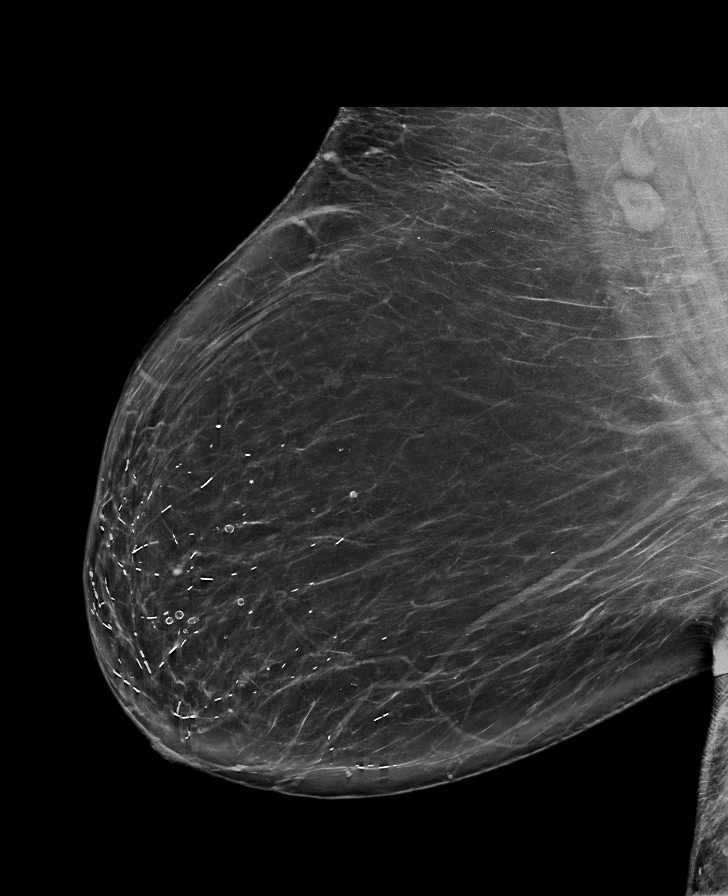

[R CC synth-2D]
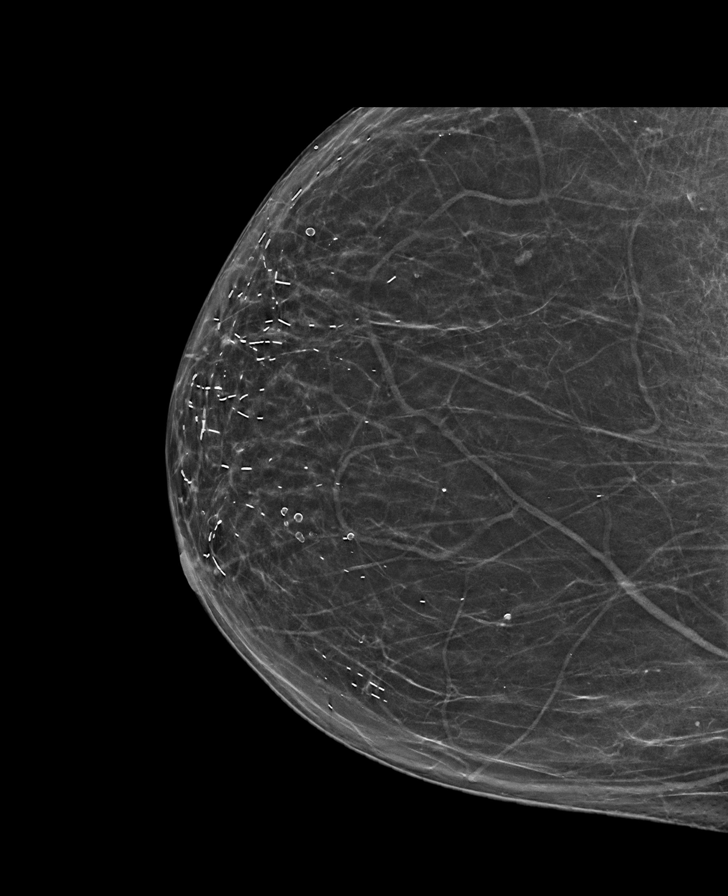

[R MLO]
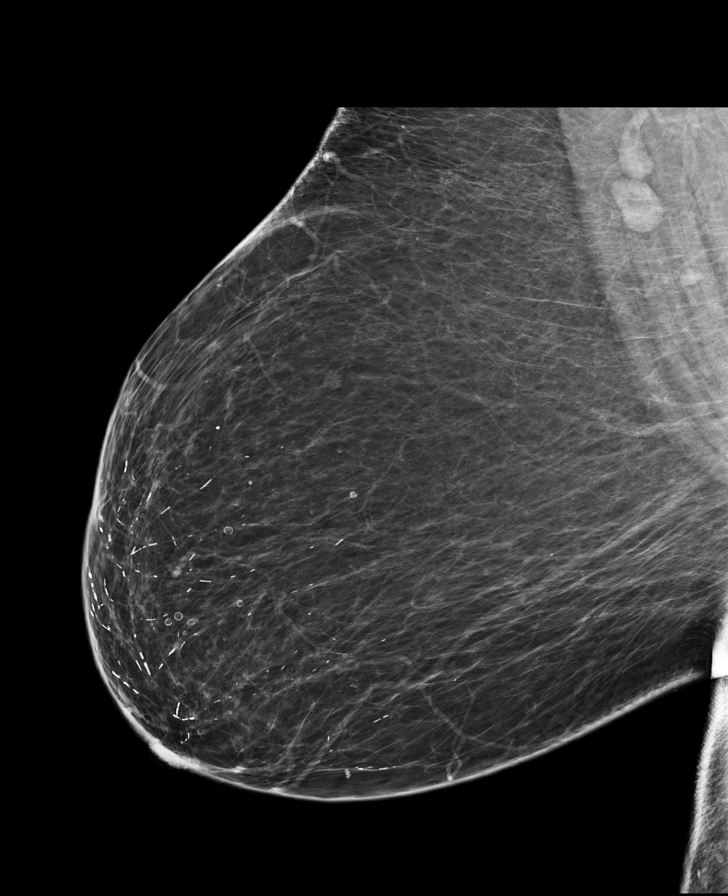

[L CC]
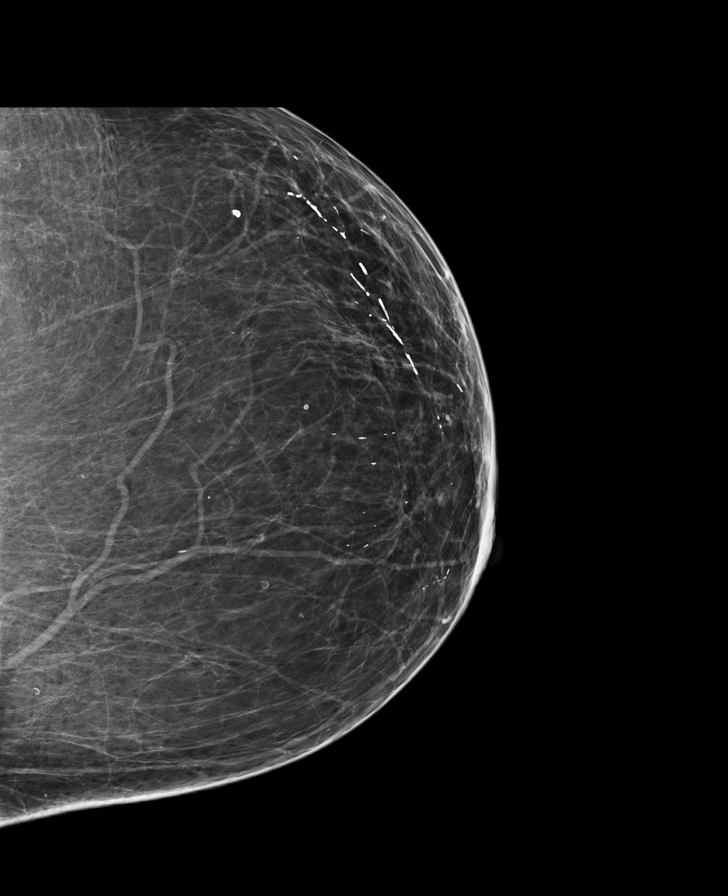

[L MLO]
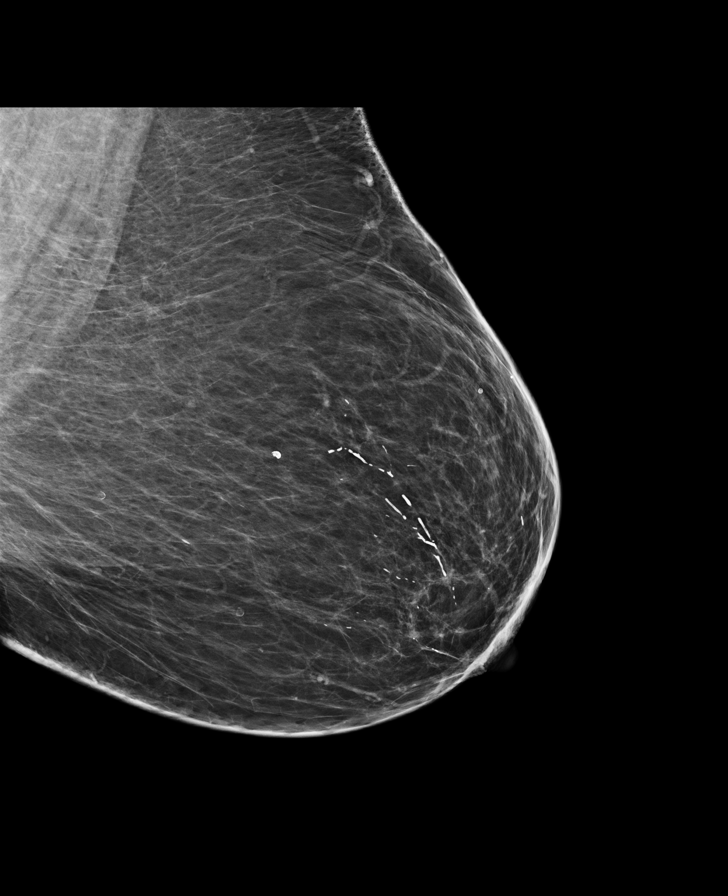

[8 of 28 positions shown; findings below may reference images not displayed]

FINDINGS: There are no findings suspicious for malignancy. Images were
processed with [name].
IMPRESSION: No mammographic evidence of malignancy. A result letter of this
screening mammogram will be mailed directly to the patient.

RECOMMENDATION:
Screening mammogram in one year. (Code:1B-X-8P0)

BI-RADS CATEGORY  1: Negative.

## 2018-06-11 ENCOUNTER — Other Ambulatory Visit: Payer: Self-pay | Admitting: Cardiovascular Disease

## 2018-06-30 DIAGNOSIS — E782 Mixed hyperlipidemia: Secondary | ICD-10-CM | POA: Diagnosis not present

## 2018-06-30 DIAGNOSIS — I5032 Chronic diastolic (congestive) heart failure: Secondary | ICD-10-CM | POA: Diagnosis not present

## 2018-06-30 DIAGNOSIS — I10 Essential (primary) hypertension: Secondary | ICD-10-CM | POA: Diagnosis not present

## 2018-06-30 DIAGNOSIS — K219 Gastro-esophageal reflux disease without esophagitis: Secondary | ICD-10-CM | POA: Diagnosis not present

## 2018-06-30 DIAGNOSIS — D519 Vitamin B12 deficiency anemia, unspecified: Secondary | ICD-10-CM | POA: Diagnosis not present

## 2018-06-30 DIAGNOSIS — D529 Folate deficiency anemia, unspecified: Secondary | ICD-10-CM | POA: Diagnosis not present

## 2018-06-30 DIAGNOSIS — Z9189 Other specified personal risk factors, not elsewhere classified: Secondary | ICD-10-CM | POA: Diagnosis not present

## 2018-06-30 DIAGNOSIS — E119 Type 2 diabetes mellitus without complications: Secondary | ICD-10-CM | POA: Diagnosis not present

## 2018-06-30 DIAGNOSIS — D509 Iron deficiency anemia, unspecified: Secondary | ICD-10-CM | POA: Diagnosis not present

## 2018-07-07 DIAGNOSIS — E782 Mixed hyperlipidemia: Secondary | ICD-10-CM | POA: Diagnosis not present

## 2018-07-07 DIAGNOSIS — Z6834 Body mass index (BMI) 34.0-34.9, adult: Secondary | ICD-10-CM | POA: Diagnosis not present

## 2018-07-07 DIAGNOSIS — I4891 Unspecified atrial fibrillation: Secondary | ICD-10-CM | POA: Diagnosis not present

## 2018-07-07 DIAGNOSIS — E119 Type 2 diabetes mellitus without complications: Secondary | ICD-10-CM | POA: Diagnosis not present

## 2018-07-07 DIAGNOSIS — I10 Essential (primary) hypertension: Secondary | ICD-10-CM | POA: Diagnosis not present

## 2018-07-07 DIAGNOSIS — I340 Nonrheumatic mitral (valve) insufficiency: Secondary | ICD-10-CM | POA: Diagnosis not present

## 2018-07-07 DIAGNOSIS — I5032 Chronic diastolic (congestive) heart failure: Secondary | ICD-10-CM | POA: Diagnosis not present

## 2018-08-04 DIAGNOSIS — I10 Essential (primary) hypertension: Secondary | ICD-10-CM | POA: Diagnosis not present

## 2018-08-04 DIAGNOSIS — E782 Mixed hyperlipidemia: Secondary | ICD-10-CM | POA: Diagnosis not present

## 2018-08-04 DIAGNOSIS — K219 Gastro-esophageal reflux disease without esophagitis: Secondary | ICD-10-CM | POA: Diagnosis not present

## 2018-08-04 DIAGNOSIS — I482 Chronic atrial fibrillation, unspecified: Secondary | ICD-10-CM | POA: Diagnosis not present

## 2018-08-16 DIAGNOSIS — Z8673 Personal history of transient ischemic attack (TIA), and cerebral infarction without residual deficits: Secondary | ICD-10-CM | POA: Diagnosis not present

## 2018-08-16 DIAGNOSIS — Z79899 Other long term (current) drug therapy: Secondary | ICD-10-CM | POA: Diagnosis not present

## 2018-08-16 DIAGNOSIS — R040 Epistaxis: Secondary | ICD-10-CM | POA: Diagnosis not present

## 2018-08-16 DIAGNOSIS — E785 Hyperlipidemia, unspecified: Secondary | ICD-10-CM | POA: Diagnosis not present

## 2018-08-16 DIAGNOSIS — I4891 Unspecified atrial fibrillation: Secondary | ICD-10-CM | POA: Diagnosis not present

## 2018-08-16 DIAGNOSIS — I10 Essential (primary) hypertension: Secondary | ICD-10-CM | POA: Diagnosis not present

## 2018-08-16 DIAGNOSIS — Z7901 Long term (current) use of anticoagulants: Secondary | ICD-10-CM | POA: Diagnosis not present

## 2018-09-04 DIAGNOSIS — E782 Mixed hyperlipidemia: Secondary | ICD-10-CM | POA: Diagnosis not present

## 2018-09-04 DIAGNOSIS — I10 Essential (primary) hypertension: Secondary | ICD-10-CM | POA: Diagnosis not present

## 2018-09-25 DIAGNOSIS — K922 Gastrointestinal hemorrhage, unspecified: Secondary | ICD-10-CM | POA: Diagnosis not present

## 2018-09-25 DIAGNOSIS — E785 Hyperlipidemia, unspecified: Secondary | ICD-10-CM | POA: Diagnosis not present

## 2018-09-25 DIAGNOSIS — R531 Weakness: Secondary | ICD-10-CM | POA: Diagnosis not present

## 2018-09-25 DIAGNOSIS — K5730 Diverticulosis of large intestine without perforation or abscess without bleeding: Secondary | ICD-10-CM | POA: Diagnosis not present

## 2018-09-25 DIAGNOSIS — Z8673 Personal history of transient ischemic attack (TIA), and cerebral infarction without residual deficits: Secondary | ICD-10-CM | POA: Diagnosis not present

## 2018-09-25 DIAGNOSIS — Z7901 Long term (current) use of anticoagulants: Secondary | ICD-10-CM | POA: Diagnosis not present

## 2018-09-25 DIAGNOSIS — R110 Nausea: Secondary | ICD-10-CM | POA: Diagnosis not present

## 2018-09-25 DIAGNOSIS — Z79899 Other long term (current) drug therapy: Secondary | ICD-10-CM | POA: Diagnosis not present

## 2018-09-25 DIAGNOSIS — R1111 Vomiting without nausea: Secondary | ICD-10-CM | POA: Diagnosis not present

## 2018-09-25 DIAGNOSIS — I10 Essential (primary) hypertension: Secondary | ICD-10-CM | POA: Diagnosis not present

## 2018-09-25 DIAGNOSIS — I4891 Unspecified atrial fibrillation: Secondary | ICD-10-CM | POA: Diagnosis not present

## 2018-09-25 DIAGNOSIS — D689 Coagulation defect, unspecified: Secondary | ICD-10-CM | POA: Diagnosis not present

## 2018-09-25 DIAGNOSIS — R112 Nausea with vomiting, unspecified: Secondary | ICD-10-CM | POA: Diagnosis not present

## 2018-09-26 DIAGNOSIS — I4819 Other persistent atrial fibrillation: Secondary | ICD-10-CM | POA: Diagnosis not present

## 2018-09-26 DIAGNOSIS — K922 Gastrointestinal hemorrhage, unspecified: Secondary | ICD-10-CM | POA: Diagnosis not present

## 2018-09-26 DIAGNOSIS — Z6834 Body mass index (BMI) 34.0-34.9, adult: Secondary | ICD-10-CM | POA: Diagnosis not present

## 2018-09-26 DIAGNOSIS — K5730 Diverticulosis of large intestine without perforation or abscess without bleeding: Secondary | ICD-10-CM | POA: Diagnosis not present

## 2018-09-26 DIAGNOSIS — E669 Obesity, unspecified: Secondary | ICD-10-CM | POA: Diagnosis not present

## 2018-09-26 DIAGNOSIS — D62 Acute posthemorrhagic anemia: Secondary | ICD-10-CM | POA: Diagnosis present

## 2018-09-26 DIAGNOSIS — I10 Essential (primary) hypertension: Secondary | ICD-10-CM | POA: Diagnosis present

## 2018-09-26 DIAGNOSIS — E785 Hyperlipidemia, unspecified: Secondary | ICD-10-CM | POA: Diagnosis present

## 2018-09-26 DIAGNOSIS — K648 Other hemorrhoids: Secondary | ICD-10-CM | POA: Diagnosis present

## 2018-09-26 DIAGNOSIS — R531 Weakness: Secondary | ICD-10-CM | POA: Diagnosis present

## 2018-09-26 DIAGNOSIS — K5731 Diverticulosis of large intestine without perforation or abscess with bleeding: Secondary | ICD-10-CM | POA: Diagnosis not present

## 2018-09-26 DIAGNOSIS — K449 Diaphragmatic hernia without obstruction or gangrene: Secondary | ICD-10-CM | POA: Diagnosis not present

## 2018-09-26 DIAGNOSIS — K625 Hemorrhage of anus and rectum: Secondary | ICD-10-CM | POA: Diagnosis not present

## 2018-09-26 DIAGNOSIS — Z885 Allergy status to narcotic agent status: Secondary | ICD-10-CM | POA: Diagnosis not present

## 2018-09-26 DIAGNOSIS — Z8673 Personal history of transient ischemic attack (TIA), and cerebral infarction without residual deficits: Secondary | ICD-10-CM | POA: Diagnosis not present

## 2018-09-26 DIAGNOSIS — D509 Iron deficiency anemia, unspecified: Secondary | ICD-10-CM | POA: Diagnosis not present

## 2018-09-26 DIAGNOSIS — K921 Melena: Secondary | ICD-10-CM | POA: Diagnosis present

## 2018-09-26 DIAGNOSIS — M1990 Unspecified osteoarthritis, unspecified site: Secondary | ICD-10-CM | POA: Diagnosis present

## 2018-09-26 DIAGNOSIS — Z7901 Long term (current) use of anticoagulants: Secondary | ICD-10-CM | POA: Diagnosis not present

## 2018-09-26 DIAGNOSIS — K8020 Calculus of gallbladder without cholecystitis without obstruction: Secondary | ICD-10-CM | POA: Diagnosis not present

## 2018-09-26 DIAGNOSIS — Z79899 Other long term (current) drug therapy: Secondary | ICD-10-CM | POA: Diagnosis not present

## 2018-09-26 DIAGNOSIS — I4891 Unspecified atrial fibrillation: Secondary | ICD-10-CM | POA: Diagnosis not present

## 2018-09-26 DIAGNOSIS — R5381 Other malaise: Secondary | ICD-10-CM | POA: Diagnosis not present

## 2018-09-26 DIAGNOSIS — K760 Fatty (change of) liver, not elsewhere classified: Secondary | ICD-10-CM | POA: Diagnosis not present

## 2018-09-26 DIAGNOSIS — R112 Nausea with vomiting, unspecified: Secondary | ICD-10-CM | POA: Diagnosis present

## 2018-09-26 DIAGNOSIS — R578 Other shock: Secondary | ICD-10-CM | POA: Diagnosis present

## 2018-10-02 DIAGNOSIS — D519 Vitamin B12 deficiency anemia, unspecified: Secondary | ICD-10-CM | POA: Diagnosis not present

## 2018-10-02 DIAGNOSIS — D509 Iron deficiency anemia, unspecified: Secondary | ICD-10-CM | POA: Diagnosis not present

## 2018-10-02 DIAGNOSIS — E119 Type 2 diabetes mellitus without complications: Secondary | ICD-10-CM | POA: Diagnosis not present

## 2018-10-02 DIAGNOSIS — E782 Mixed hyperlipidemia: Secondary | ICD-10-CM | POA: Diagnosis not present

## 2018-10-02 DIAGNOSIS — I10 Essential (primary) hypertension: Secondary | ICD-10-CM | POA: Diagnosis not present

## 2018-10-02 DIAGNOSIS — D529 Folate deficiency anemia, unspecified: Secondary | ICD-10-CM | POA: Diagnosis not present

## 2018-10-06 DIAGNOSIS — Z6834 Body mass index (BMI) 34.0-34.9, adult: Secondary | ICD-10-CM | POA: Diagnosis not present

## 2018-10-06 DIAGNOSIS — Z0001 Encounter for general adult medical examination with abnormal findings: Secondary | ICD-10-CM | POA: Diagnosis not present

## 2018-10-06 DIAGNOSIS — I10 Essential (primary) hypertension: Secondary | ICD-10-CM | POA: Diagnosis not present

## 2018-10-16 DIAGNOSIS — H35373 Puckering of macula, bilateral: Secondary | ICD-10-CM | POA: Diagnosis not present

## 2018-10-16 DIAGNOSIS — H524 Presbyopia: Secondary | ICD-10-CM | POA: Diagnosis not present

## 2018-10-16 DIAGNOSIS — Z961 Presence of intraocular lens: Secondary | ICD-10-CM | POA: Diagnosis not present

## 2018-10-27 ENCOUNTER — Telehealth: Payer: Self-pay | Admitting: *Deleted

## 2018-10-27 DIAGNOSIS — I10 Essential (primary) hypertension: Secondary | ICD-10-CM

## 2018-10-27 NOTE — Telephone Encounter (Signed)
   Primary Cardiologist:  [name], MD   Patient contacted.  History reviewed.  No symptoms to suggest any unstable cardiac conditions.  Based on discussion, with current pandemic situation, we will be postponing this appointment till 02/05/2019.  If symptoms change, she has been instructed to contact our office.  Recent hospital visit at [facility] was not cardiac related per patient.  Stated she was doing great, but her daughter wanted her to see if she may be candidate for watchman device.  Explained to patient, per Dr. Bronson Ing - no proved data that these device are beneficial and are not doing them anymore here.  She verbalized understanding.      [name]

## 2018-10-27 NOTE — Telephone Encounter (Signed)
Patient c/o feet swelling x 2-3 days.  No weight gain, SOB, chest pain, or dizziness.  No extra salt intake.  No abdominal swelling.  She is currently taking Lasix 20mg  daily.  Also, he Lopressor is listed as 37.5mg  twice a day, but states he medications is bubble packed and shows only 25mg  twice a day.

## 2018-10-28 ENCOUNTER — Ambulatory Visit: Payer: Medicare Other | Admitting: Cardiovascular Disease

## 2018-10-28 MED ORDER — FUROSEMIDE 20 MG PO TABS: 40.0000 mg | ORAL_TABLET | Freq: Every day | ORAL | 6 refills | Status: DC

## 2018-10-28 MED ORDER — POTASSIUM CHLORIDE CRYS ER 20 MEQ PO TBCR: 20.0000 meq | EXTENDED_RELEASE_TABLET | Freq: Every day | ORAL | 6 refills | Status: DC

## 2018-10-28 NOTE — Telephone Encounter (Signed)
Can increase Lasix to 40 mg daily and add KCl 20 meq daily. Would continue Lasix 40 mg daily x 3 days, then reduce back to 20 mg daily. Can continue KCl 20 meq daily. Check BMET in 3 days.

## 2018-10-28 NOTE — Telephone Encounter (Signed)
Patient informed and verbalized understanding of plan. rx's sent to [facility]. Lab order mailed to home address.

## 2018-10-29 ENCOUNTER — Other Ambulatory Visit: Payer: Self-pay | Admitting: Cardiovascular Disease

## 2018-10-31 DIAGNOSIS — R7301 Impaired fasting glucose: Secondary | ICD-10-CM | POA: Diagnosis not present

## 2018-10-31 DIAGNOSIS — I10 Essential (primary) hypertension: Secondary | ICD-10-CM | POA: Diagnosis not present

## 2018-10-31 DIAGNOSIS — D509 Iron deficiency anemia, unspecified: Secondary | ICD-10-CM | POA: Diagnosis not present

## 2018-10-31 DIAGNOSIS — I421 Obstructive hypertrophic cardiomyopathy: Secondary | ICD-10-CM | POA: Diagnosis not present

## 2018-10-31 DIAGNOSIS — E782 Mixed hyperlipidemia: Secondary | ICD-10-CM | POA: Diagnosis not present

## 2018-10-31 DIAGNOSIS — K219 Gastro-esophageal reflux disease without esophagitis: Secondary | ICD-10-CM | POA: Diagnosis not present

## 2018-10-31 DIAGNOSIS — I4891 Unspecified atrial fibrillation: Secondary | ICD-10-CM | POA: Diagnosis not present

## 2018-10-31 DIAGNOSIS — I5032 Chronic diastolic (congestive) heart failure: Secondary | ICD-10-CM | POA: Diagnosis not present

## 2018-11-14 ENCOUNTER — Other Ambulatory Visit: Payer: Self-pay | Admitting: Cardiovascular Disease

## 2018-11-17 DIAGNOSIS — E119 Type 2 diabetes mellitus without complications: Secondary | ICD-10-CM | POA: Diagnosis not present

## 2018-11-17 DIAGNOSIS — K219 Gastro-esophageal reflux disease without esophagitis: Secondary | ICD-10-CM | POA: Diagnosis not present

## 2018-11-17 DIAGNOSIS — I10 Essential (primary) hypertension: Secondary | ICD-10-CM | POA: Diagnosis not present

## 2018-11-17 DIAGNOSIS — E782 Mixed hyperlipidemia: Secondary | ICD-10-CM | POA: Diagnosis not present

## 2018-11-17 DIAGNOSIS — I5032 Chronic diastolic (congestive) heart failure: Secondary | ICD-10-CM | POA: Diagnosis not present

## 2018-11-17 DIAGNOSIS — D529 Folate deficiency anemia, unspecified: Secondary | ICD-10-CM | POA: Diagnosis not present

## 2018-11-19 ENCOUNTER — Ambulatory Visit: Payer: Medicare Other | Admitting: Cardiovascular Disease

## 2018-12-17 DIAGNOSIS — L570 Actinic keratosis: Secondary | ICD-10-CM | POA: Diagnosis not present

## 2018-12-17 DIAGNOSIS — L821 Other seborrheic keratosis: Secondary | ICD-10-CM | POA: Diagnosis not present

## 2018-12-17 DIAGNOSIS — L280 Lichen simplex chronicus: Secondary | ICD-10-CM | POA: Diagnosis not present

## 2019-01-03 DIAGNOSIS — E782 Mixed hyperlipidemia: Secondary | ICD-10-CM | POA: Diagnosis not present

## 2019-01-03 DIAGNOSIS — I10 Essential (primary) hypertension: Secondary | ICD-10-CM | POA: Diagnosis not present

## 2019-01-24 ENCOUNTER — Encounter: Payer: Self-pay | Admitting: Cardiovascular Disease

## 2019-01-28 ENCOUNTER — Telehealth: Payer: Self-pay | Admitting: *Deleted

## 2019-01-28 ENCOUNTER — Encounter: Payer: Self-pay | Admitting: *Deleted

## 2019-01-28 NOTE — Telephone Encounter (Signed)
Patient verbally consented for tele-health visits with [facility] and understands that her insurance company will be billed for the encounter.  Aware to have vitals available   

## 2019-02-02 DIAGNOSIS — R7301 Impaired fasting glucose: Secondary | ICD-10-CM | POA: Diagnosis not present

## 2019-02-02 DIAGNOSIS — E119 Type 2 diabetes mellitus without complications: Secondary | ICD-10-CM | POA: Diagnosis not present

## 2019-02-02 DIAGNOSIS — E782 Mixed hyperlipidemia: Secondary | ICD-10-CM | POA: Diagnosis not present

## 2019-02-02 DIAGNOSIS — I5032 Chronic diastolic (congestive) heart failure: Secondary | ICD-10-CM | POA: Diagnosis not present

## 2019-02-02 DIAGNOSIS — I10 Essential (primary) hypertension: Secondary | ICD-10-CM | POA: Diagnosis not present

## 2019-02-02 DIAGNOSIS — D529 Folate deficiency anemia, unspecified: Secondary | ICD-10-CM | POA: Diagnosis not present

## 2019-02-03 DIAGNOSIS — I10 Essential (primary) hypertension: Secondary | ICD-10-CM | POA: Diagnosis not present

## 2019-02-03 DIAGNOSIS — E782 Mixed hyperlipidemia: Secondary | ICD-10-CM | POA: Diagnosis not present

## 2019-02-05 ENCOUNTER — Other Ambulatory Visit: Payer: Self-pay | Admitting: Unknown Physician Specialty

## 2019-02-05 DIAGNOSIS — Z1231 Encounter for screening mammogram for malignant neoplasm of breast: Secondary | ICD-10-CM

## 2019-02-05 DIAGNOSIS — Z6836 Body mass index (BMI) 36.0-36.9, adult: Secondary | ICD-10-CM | POA: Diagnosis not present

## 2019-02-05 DIAGNOSIS — E782 Mixed hyperlipidemia: Secondary | ICD-10-CM | POA: Diagnosis not present

## 2019-02-05 DIAGNOSIS — I10 Essential (primary) hypertension: Secondary | ICD-10-CM | POA: Diagnosis not present

## 2019-02-05 DIAGNOSIS — I5032 Chronic diastolic (congestive) heart failure: Secondary | ICD-10-CM | POA: Diagnosis not present

## 2019-02-05 DIAGNOSIS — I4891 Unspecified atrial fibrillation: Secondary | ICD-10-CM | POA: Diagnosis not present

## 2019-02-05 DIAGNOSIS — I340 Nonrheumatic mitral (valve) insufficiency: Secondary | ICD-10-CM | POA: Diagnosis not present

## 2019-02-05 DIAGNOSIS — E119 Type 2 diabetes mellitus without complications: Secondary | ICD-10-CM | POA: Diagnosis not present

## 2019-02-09 ENCOUNTER — Encounter

## 2019-02-09 ENCOUNTER — Encounter: Payer: Self-pay | Admitting: Cardiovascular Disease

## 2019-02-09 ENCOUNTER — Encounter: Payer: Self-pay | Admitting: *Deleted

## 2019-02-09 ENCOUNTER — Telehealth (INDEPENDENT_AMBULATORY_CARE_PROVIDER_SITE_OTHER): Payer: Medicare Other | Admitting: Cardiovascular Disease

## 2019-02-09 VITALS — BP 128/82 | HR 83 | Ht 64.0 in | Wt 207.0 lb

## 2019-02-09 DIAGNOSIS — I340 Nonrheumatic mitral (valve) insufficiency: Secondary | ICD-10-CM

## 2019-02-09 DIAGNOSIS — I4821 Permanent atrial fibrillation: Secondary | ICD-10-CM

## 2019-02-09 DIAGNOSIS — I10 Essential (primary) hypertension: Secondary | ICD-10-CM

## 2019-02-09 DIAGNOSIS — Z8719 Personal history of other diseases of the digestive system: Secondary | ICD-10-CM

## 2019-02-09 DIAGNOSIS — Z8673 Personal history of transient ischemic attack (TIA), and cerebral infarction without residual deficits: Secondary | ICD-10-CM

## 2019-02-09 DIAGNOSIS — I119 Hypertensive heart disease without heart failure: Secondary | ICD-10-CM

## 2019-02-09 DIAGNOSIS — E785 Hyperlipidemia, unspecified: Secondary | ICD-10-CM

## 2019-02-09 DIAGNOSIS — Z7901 Long term (current) use of anticoagulants: Secondary | ICD-10-CM

## 2019-02-09 NOTE — Progress Notes (Signed)
Virtual Visit via Telephone Note   This visit type was conducted due to national recommendations for restrictions regarding the COVID-19 Pandemic (e.g. social distancing) in an effort to limit this patient's exposure and mitigate transmission in our community.  Due to her co-morbid illnesses, this patient is at least at moderate risk for complications without adequate follow up.  This format is felt to be most appropriate for this patient at this time.  The patient did not have access to video technology/had technical difficulties with video requiring transitioning to audio format only (telephone).  All issues noted in this document were discussed and addressed.  No physical exam could be performed with this format.  Please refer to the patient's chart for her  consent to telehealth for [facility].   Date:  02/09/2019   ID:  [name], DOB January 09, 1946, MRN [id number]  Patient Location: Home Provider Location: Office  PCP:  Caryl Bis, MD  Cardiologist:  [name], MD  Electrophysiologist:  None   Evaluation Performed:  Follow-Up Visit  Chief Complaint: Atrial fibrillation  History of Present Illness:    [name] is a 73 y.o. female with atrial fibrillation and hypertensive cardiomyopathy/chronic diastolic heart failure.  It appears she was hospitalized for a suspected lower GI bleed in February 2020.  She presented to [facility] with hemorrhagic shock and hemoglobin of 4.2 as per the discharge summary which I reviewed.  She apparently received 4 units of blood and hemoglobin was stable between 8 and 9 during her hospitalization.  She will underwent an EGD and colonoscopy that showed a normal esophagus, small hiatal hernia, normal duodenum, sigmoid diverticulosis, and internal hemorrhoids.  She was started on iron supplementation.  She was also discharged on Cardizem and metoprolol.  She was continued on Eliquis.  Apparently, a Watchman device had been  considered at one point by [employer].  She denies any further problems with bleeding.  She has some occasional right foot swelling but none today.  She had a prior fracture of her right foot 30 years ago.  She had labs checked by her PCP 3 or 4 days ago.  I will have to request a copy of this.  She denies chest pain, palpitations, shortness of breath, hematochezia, and melena.  The patient does not have symptoms concerning for COVID-19 infection (fever, chills, cough, or new shortness of breath).    Past Medical History:  Diagnosis Date  . [name] (coronary artery disease) 2008   mild  . Dysrhythmia    AFib  . Hypertensive cardiovascular disease   . Left atrial dilatation 2013  . Obesity   . Persistent atrial fibrillation   . 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 ABDOMINAL HYSTERECTOMY    . ABDOMINAL HYSTERECTOMY    . BIOPSY THYROID     x2  . CARDIOVERSION N/A 07/22/2014   Procedure: CARDIOVERSION;  Surgeon: Sueanne Margarita, MD;  Location: [facility] ENDOSCOPY;  Service: Cardiovascular;  Laterality: N/A;  . CATARACT EXTRACTION W/PHACO Left 05/19/2018   Procedure: CATARACT EXTRACTION PHACO AND INTRAOCULAR LENS PLACEMENT ([place]);  Surgeon: Tonny Branch, MD;  Location: [facility] ORS;  Service: Ophthalmology;  Laterality: Left;  CDE: 12.99  . CATARACT EXTRACTION W/PHACO Right 06/02/2018   Procedure: CATARACT EXTRACTION PHACO AND INTRAOCULAR LENS PLACEMENT RIGHT EYE CDE=7.18;  Surgeon: Tonny Branch, MD;  Location: [facility] ORS;  Service: Ophthalmology;  Laterality: Right;  right  .  LEFT HEART CATHETERIZATION WITH CORONARY ANGIOGRAM N/A 07/19/2014   Procedure: LEFT HEART CATHETERIZATION WITH CORONARY ANGIOGRAM;  Surgeon: Blane Ohara, MD;  Location: [facility] CATH LAB;  Service: Cardiovascular;  Laterality: N/A;  . TEE WITHOUT CARDIOVERSION N/A 07/22/2014   Procedure: TRANSESOPHAGEAL ECHOCARDIOGRAM (TEE);  Surgeon: Sueanne Margarita, MD;  Location: [facility]  ENDOSCOPY;  Service: Cardiovascular;  Laterality: N/A;  . THYROIDECTOMY, PARTIAL       Current Meds  Medication Sig  . Ascorbic Acid (VITAMIN C) 1000 MG tablet Take 500 mg by mouth 2 (two) times daily.   . Calcium Carb-Cholecalciferol (CALCIUM + D3) 600-200 MG-UNIT TABS Take 1 tablet by mouth daily.  . cholecalciferol (VITAMIN D) 1000 UNITS tablet Take 2,000 Units by mouth daily.   . Coenzyme Q10 (COQ-10) 100 MG CAPS Take 1 capsule by mouth daily.  Marland Kitchen diltiazem (DILTIAZEM CD) 120 MG 24 hr capsule Take 120 mg by mouth daily.  Marland Kitchen ELIQUIS 5 MG TABS tablet TAKE 1 TABLET BY MOUTH TWICE DAILY.  . furosemide (LASIX) 20 MG tablet Take 20 mg by mouth daily.  Marland Kitchen glucosamine-chondroitin 500-400 MG tablet Take 1 tablet by mouth 2 (two) times daily.   . iron polysaccharides (NIFEREX) 150 MG capsule Take 150 mg by mouth 2 (two) times daily.  . metoprolol tartrate (LOPRESSOR) 25 MG tablet TAKE 1&1/2 TABLETS BY MOUTH TWICE DAILY  . pantoprazole (PROTONIX) 40 MG tablet Take 40 mg by mouth daily.   . potassium chloride SA (K-DUR,KLOR-CON) 20 MEQ tablet Take 1 tablet (20 mEq total) by mouth daily.  . pravastatin (PRAVACHOL) 20 MG tablet Take 20 mg by mouth every evening.   . [DISCONTINUED] furosemide (LASIX) 20 MG tablet Take 2 tablets (40 mg total) by mouth daily. For 3 days, then resume 20 mg daily (Patient taking differently: Take 20 mg by mouth daily. For 3 days, then resume 20 mg daily)     Allergies:   Codeine   Social History   Tobacco Use  . Smoking status: Never Smoker  . Smokeless tobacco: Never Used  Substance Use Topics  . Alcohol use: No    Alcohol/week: 0.0 standard drinks  . Drug use: No     Family Hx: The patient's family history includes Emphysema in her mother; Hypertension in an other family member. There is no history of Colon cancer or Colon polyps.  ROS:   Please see the history of present illness.     All other systems reviewed and are negative.   Prior CV studies:   The  following studies were reviewed today:  NA  Labs/Other Tests and Data Reviewed:    EKG:  No ECG reviewed.  Recent Labs: 05/13/2018: BUN 7; Creatinine, Ser 0.65; Hemoglobin 15.6; Platelets 209; Potassium 3.2; Sodium 142   Recent Lipid Panel Lab Results  Component Value Date/Time   CHOL 146 07/19/2014 02:13 AM   TRIG 234 (H) 07/19/2014 02:13 AM   HDL 32 (L) 07/19/2014 02:13 AM   CHOLHDL 4.6 07/19/2014 02:13 AM   LDLCALC 67 07/19/2014 02:13 AM    Wt Readings from Last 3 Encounters:  02/09/19 207 lb (93.9 kg)  06/02/18 204 lb 6.4 oz (92.7 kg)  05/22/18 204 lb 6.4 oz (92.7 kg)     Objective:    Vital Signs:  BP 128/82   Pulse 83   Ht 5\' 4"  (1.626 m)   Wt 207 lb (93.9 kg)   BMI 35.53 kg/m    VITAL SIGNS:  reviewed  ASSESSMENT & PLAN:  1.  Permanent atrial fibrillation: Symptomatically stable.  Heart rate is controlled on diltiazem CD 120 mg daily and Lopressor 37.5 mg twice daily.  Anticoagulated with Eliquis.  No changes.  Apparently, a Watchman device had been considered at one point by [employer]. I'm not certain that there is enough data to support this but if the issue arose again, I would confer with EP.  2.  Hypertensive cardiomyopathy/chronic diastolic heart failure: Euvolemic on Lasix 20 mg daily.    3.  Hypertension: Blood pressure is normal. No changes.  4.  Hyperlipidemia: Currently on pravastatin 20 mg.  5.  Mitral regurgitation: Moderate in severity.  I will monitor.  6. GI bleed: Denies hematochezia and melena. I will obtain a copy of most recent labs from her PCP including a hemoglobin.  She remains on Eliquis 5 mg twice daily.  She is on supplemental iron. Apparently, a Watchman device had been considered at one point by [employer]. I'm not certain that there is enough data to support this but if the issue arose again, I would confer with EP.  7. History of CVA: On Eliquis and statin.   COVID-19 Education: The signs and symptoms of COVID-19 were  discussed with the patient and how to seek care for testing (follow up with PCP or arrange E-visit).  The importance of social distancing was discussed today.  Time:   Today, I have spent 15 minutes with the patient with telehealth technology discussing the above problems.     Medication Adjustments/Labs and Tests Ordered: Current medicines are reviewed at length with the patient today.  Concerns regarding medicines are outlined above.   Tests Ordered: No orders of the defined types were placed in this encounter.   Medication Changes: No orders of the defined types were placed in this encounter.   Follow Up:  Virtual Visit or In Person in 6 month(s)  Signed, [name], MD  02/09/2019 9:04 AM    [place]

## 2019-02-09 NOTE — Patient Instructions (Signed)

## 2019-03-24 ENCOUNTER — Ambulatory Visit: Payer: Medicare Other

## 2019-04-06 DIAGNOSIS — E782 Mixed hyperlipidemia: Secondary | ICD-10-CM | POA: Diagnosis not present

## 2019-04-06 DIAGNOSIS — I10 Essential (primary) hypertension: Secondary | ICD-10-CM | POA: Diagnosis not present

## 2019-04-06 DIAGNOSIS — I5032 Chronic diastolic (congestive) heart failure: Secondary | ICD-10-CM | POA: Diagnosis not present

## 2019-04-17 ENCOUNTER — Other Ambulatory Visit: Payer: Self-pay | Admitting: Cardiovascular Disease

## 2019-04-20 DIAGNOSIS — Z23 Encounter for immunization: Secondary | ICD-10-CM | POA: Diagnosis not present

## 2019-04-27 DIAGNOSIS — L570 Actinic keratosis: Secondary | ICD-10-CM | POA: Diagnosis not present

## 2019-05-06 DIAGNOSIS — E782 Mixed hyperlipidemia: Secondary | ICD-10-CM | POA: Diagnosis not present

## 2019-05-06 DIAGNOSIS — I10 Essential (primary) hypertension: Secondary | ICD-10-CM | POA: Diagnosis not present

## 2019-05-13 ENCOUNTER — Other Ambulatory Visit: Payer: Self-pay | Admitting: Cardiovascular Disease

## 2019-06-11 DIAGNOSIS — I340 Nonrheumatic mitral (valve) insufficiency: Secondary | ICD-10-CM | POA: Diagnosis not present

## 2019-06-11 DIAGNOSIS — E782 Mixed hyperlipidemia: Secondary | ICD-10-CM | POA: Diagnosis not present

## 2019-06-11 DIAGNOSIS — I10 Essential (primary) hypertension: Secondary | ICD-10-CM | POA: Diagnosis not present

## 2019-06-11 DIAGNOSIS — D509 Iron deficiency anemia, unspecified: Secondary | ICD-10-CM | POA: Diagnosis not present

## 2019-06-11 DIAGNOSIS — K219 Gastro-esophageal reflux disease without esophagitis: Secondary | ICD-10-CM | POA: Diagnosis not present

## 2019-06-11 DIAGNOSIS — E119 Type 2 diabetes mellitus without complications: Secondary | ICD-10-CM | POA: Diagnosis not present

## 2019-06-11 DIAGNOSIS — D529 Folate deficiency anemia, unspecified: Secondary | ICD-10-CM | POA: Diagnosis not present

## 2019-06-11 DIAGNOSIS — I5032 Chronic diastolic (congestive) heart failure: Secondary | ICD-10-CM | POA: Diagnosis not present

## 2019-06-11 DIAGNOSIS — D519 Vitamin B12 deficiency anemia, unspecified: Secondary | ICD-10-CM | POA: Diagnosis not present

## 2019-06-15 DIAGNOSIS — Z1231 Encounter for screening mammogram for malignant neoplasm of breast: Secondary | ICD-10-CM | POA: Diagnosis not present

## 2019-06-17 DIAGNOSIS — E782 Mixed hyperlipidemia: Secondary | ICD-10-CM | POA: Diagnosis not present

## 2019-06-17 DIAGNOSIS — E119 Type 2 diabetes mellitus without complications: Secondary | ICD-10-CM | POA: Diagnosis not present

## 2019-06-17 DIAGNOSIS — I340 Nonrheumatic mitral (valve) insufficiency: Secondary | ICD-10-CM | POA: Diagnosis not present

## 2019-06-17 DIAGNOSIS — I10 Essential (primary) hypertension: Secondary | ICD-10-CM | POA: Diagnosis not present

## 2019-06-17 DIAGNOSIS — I4891 Unspecified atrial fibrillation: Secondary | ICD-10-CM | POA: Diagnosis not present

## 2019-06-17 DIAGNOSIS — Z6835 Body mass index (BMI) 35.0-35.9, adult: Secondary | ICD-10-CM | POA: Diagnosis not present

## 2019-06-17 DIAGNOSIS — I5032 Chronic diastolic (congestive) heart failure: Secondary | ICD-10-CM | POA: Diagnosis not present

## 2019-08-06 DIAGNOSIS — E119 Type 2 diabetes mellitus without complications: Secondary | ICD-10-CM | POA: Diagnosis not present

## 2019-08-06 DIAGNOSIS — E782 Mixed hyperlipidemia: Secondary | ICD-10-CM | POA: Diagnosis not present

## 2019-09-23 ENCOUNTER — Telehealth: Payer: Medicare Other | Admitting: Cardiovascular Disease

## 2019-09-23 ENCOUNTER — Telehealth (INDEPENDENT_AMBULATORY_CARE_PROVIDER_SITE_OTHER): Payer: Medicare Other | Admitting: Cardiovascular Disease

## 2019-09-23 ENCOUNTER — Encounter: Payer: Self-pay | Admitting: Cardiovascular Disease

## 2019-09-23 VITALS — Ht 64.5 in | Wt 203.0 lb

## 2019-09-23 DIAGNOSIS — E785 Hyperlipidemia, unspecified: Secondary | ICD-10-CM

## 2019-09-23 DIAGNOSIS — I10 Essential (primary) hypertension: Secondary | ICD-10-CM

## 2019-09-23 DIAGNOSIS — Z8719 Personal history of other diseases of the digestive system: Secondary | ICD-10-CM

## 2019-09-23 DIAGNOSIS — Z7901 Long term (current) use of anticoagulants: Secondary | ICD-10-CM

## 2019-09-23 DIAGNOSIS — I5032 Chronic diastolic (congestive) heart failure: Secondary | ICD-10-CM

## 2019-09-23 DIAGNOSIS — I4821 Permanent atrial fibrillation: Secondary | ICD-10-CM | POA: Diagnosis not present

## 2019-09-23 DIAGNOSIS — Z8673 Personal history of transient ischemic attack (TIA), and cerebral infarction without residual deficits: Secondary | ICD-10-CM

## 2019-09-23 DIAGNOSIS — I340 Nonrheumatic mitral (valve) insufficiency: Secondary | ICD-10-CM
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
## 2019-09-23 NOTE — Patient Instructions (Signed)

## 2019-09-23 NOTE — Addendum Note (Signed)
Addended by: Laurine Blazer on: 09/23/2019 01:59 PM   Modules accepted: Orders

## 2019-09-30 DIAGNOSIS — Z23 Encounter for immunization: Secondary | ICD-10-CM | POA: Diagnosis not present

## 2019-10-02 DIAGNOSIS — E7849 Other hyperlipidemia: Secondary | ICD-10-CM | POA: Diagnosis not present

## 2019-10-02 DIAGNOSIS — I10 Essential (primary) hypertension: Secondary | ICD-10-CM | POA: Diagnosis not present

## 2019-10-08 ENCOUNTER — Other Ambulatory Visit: Payer: Medicare Other

## 2019-10-09 DIAGNOSIS — E782 Mixed hyperlipidemia: Secondary | ICD-10-CM | POA: Diagnosis not present

## 2019-10-09 DIAGNOSIS — I10 Essential (primary) hypertension: Secondary | ICD-10-CM | POA: Diagnosis not present

## 2019-10-09 DIAGNOSIS — K219 Gastro-esophageal reflux disease without esophagitis: Secondary | ICD-10-CM | POA: Diagnosis not present

## 2019-10-09 DIAGNOSIS — E119 Type 2 diabetes mellitus without complications: Secondary | ICD-10-CM | POA: Diagnosis not present

## 2019-10-09 DIAGNOSIS — D519 Vitamin B12 deficiency anemia, unspecified: Secondary | ICD-10-CM | POA: Diagnosis not present

## 2019-10-09 DIAGNOSIS — D529 Folate deficiency anemia, unspecified: Secondary | ICD-10-CM | POA: Diagnosis not present

## 2019-10-09 DIAGNOSIS — D649 Anemia, unspecified: Secondary | ICD-10-CM | POA: Diagnosis not present

## 2019-10-09 DIAGNOSIS — R946 Abnormal results of thyroid function studies: Secondary | ICD-10-CM | POA: Diagnosis not present

## 2019-10-09 DIAGNOSIS — I5032 Chronic diastolic (congestive) heart failure: Secondary | ICD-10-CM | POA: Diagnosis not present

## 2019-10-09 DIAGNOSIS — B182 Chronic viral hepatitis C: Secondary | ICD-10-CM | POA: Diagnosis not present

## 2019-10-09 DIAGNOSIS — I340 Nonrheumatic mitral (valve) insufficiency: Secondary | ICD-10-CM | POA: Diagnosis not present

## 2019-10-14 DIAGNOSIS — E119 Type 2 diabetes mellitus without complications: Secondary | ICD-10-CM | POA: Diagnosis not present

## 2019-10-14 DIAGNOSIS — I340 Nonrheumatic mitral (valve) insufficiency: Secondary | ICD-10-CM | POA: Diagnosis not present

## 2019-10-14 DIAGNOSIS — Z0001 Encounter for general adult medical examination with abnormal findings: Secondary | ICD-10-CM | POA: Diagnosis not present

## 2019-10-14 DIAGNOSIS — I110 Hypertensive heart disease with heart failure: Secondary | ICD-10-CM | POA: Diagnosis not present

## 2019-10-14 DIAGNOSIS — E782 Mixed hyperlipidemia: Secondary | ICD-10-CM | POA: Diagnosis not present

## 2019-10-14 DIAGNOSIS — Z6835 Body mass index (BMI) 35.0-35.9, adult: Secondary | ICD-10-CM | POA: Diagnosis not present

## 2019-10-14 DIAGNOSIS — I5032 Chronic diastolic (congestive) heart failure: Secondary | ICD-10-CM | POA: Diagnosis not present

## 2019-10-28 DIAGNOSIS — Z23 Encounter for immunization: Secondary | ICD-10-CM | POA: Diagnosis not present

## 2019-11-04 DIAGNOSIS — E7849 Other hyperlipidemia: Secondary | ICD-10-CM | POA: Diagnosis not present

## 2019-11-04 DIAGNOSIS — I10 Essential (primary) hypertension: Secondary | ICD-10-CM | POA: Diagnosis not present

## 2019-12-04 DIAGNOSIS — I5032 Chronic diastolic (congestive) heart failure: Secondary | ICD-10-CM | POA: Diagnosis not present

## 2019-12-04 DIAGNOSIS — D649 Anemia, unspecified: Secondary | ICD-10-CM | POA: Diagnosis not present

## 2020-01-04 DIAGNOSIS — I5032 Chronic diastolic (congestive) heart failure: Secondary | ICD-10-CM | POA: Diagnosis not present

## 2020-01-04 DIAGNOSIS — I110 Hypertensive heart disease with heart failure: Secondary | ICD-10-CM | POA: Diagnosis not present

## 2020-01-04 DIAGNOSIS — E7849 Other hyperlipidemia: Secondary | ICD-10-CM | POA: Diagnosis not present

## 2020-01-04 DIAGNOSIS — J9621 Acute and chronic respiratory failure with hypoxia: Secondary | ICD-10-CM | POA: Diagnosis not present

## 2020-01-13 DIAGNOSIS — L570 Actinic keratosis: Secondary | ICD-10-CM | POA: Diagnosis not present

## 2020-01-13 DIAGNOSIS — L814 Other melanin hyperpigmentation: Secondary | ICD-10-CM | POA: Diagnosis not present

## 2020-01-13 DIAGNOSIS — D1801 Hemangioma of skin and subcutaneous tissue: Secondary | ICD-10-CM | POA: Diagnosis not present

## 2020-01-13 DIAGNOSIS — I781 Nevus, non-neoplastic: Secondary | ICD-10-CM | POA: Diagnosis not present

## 2020-03-25 ENCOUNTER — Ambulatory Visit: Payer: Medicare Other | Admitting: Cardiovascular Disease

## 2020-05-05 DIAGNOSIS — E7849 Other hyperlipidemia: Secondary | ICD-10-CM | POA: Diagnosis not present

## 2020-05-05 DIAGNOSIS — I5032 Chronic diastolic (congestive) heart failure: Secondary | ICD-10-CM | POA: Diagnosis not present

## 2020-05-05 DIAGNOSIS — I110 Hypertensive heart disease with heart failure: Secondary | ICD-10-CM | POA: Diagnosis not present

## 2020-05-06 DIAGNOSIS — Z23 Encounter for immunization: Secondary | ICD-10-CM | POA: Diagnosis not present

## 2020-05-26 DIAGNOSIS — Z01818 Encounter for other preprocedural examination: Secondary | ICD-10-CM | POA: Diagnosis not present

## 2020-05-26 DIAGNOSIS — R9431 Abnormal electrocardiogram [ECG] [EKG]: Secondary | ICD-10-CM | POA: Diagnosis not present

## 2020-05-26 DIAGNOSIS — D649 Anemia, unspecified: Secondary | ICD-10-CM | POA: Diagnosis not present

## 2020-05-26 DIAGNOSIS — E876 Hypokalemia: Secondary | ICD-10-CM | POA: Diagnosis not present

## 2020-05-26 DIAGNOSIS — S7291XA Unspecified fracture of right femur, initial encounter for closed fracture: Secondary | ICD-10-CM | POA: Diagnosis not present

## 2020-05-26 DIAGNOSIS — S0990XA Unspecified injury of head, initial encounter: Secondary | ICD-10-CM | POA: Diagnosis not present

## 2020-05-26 DIAGNOSIS — Z8673 Personal history of transient ischemic attack (TIA), and cerebral infarction without residual deficits: Secondary | ICD-10-CM | POA: Diagnosis not present

## 2020-05-26 DIAGNOSIS — I6389 Other cerebral infarction: Secondary | ICD-10-CM | POA: Diagnosis not present

## 2020-05-26 DIAGNOSIS — Z7901 Long term (current) use of anticoagulants: Secondary | ICD-10-CM | POA: Diagnosis not present

## 2020-05-26 DIAGNOSIS — I739 Peripheral vascular disease, unspecified: Secondary | ICD-10-CM | POA: Diagnosis not present

## 2020-05-26 DIAGNOSIS — I10 Essential (primary) hypertension: Secondary | ICD-10-CM | POA: Diagnosis not present

## 2020-05-26 DIAGNOSIS — M1611 Unilateral primary osteoarthritis, right hip: Secondary | ICD-10-CM | POA: Diagnosis not present

## 2020-05-26 DIAGNOSIS — I4891 Unspecified atrial fibrillation: Secondary | ICD-10-CM | POA: Diagnosis not present

## 2020-05-26 DIAGNOSIS — R110 Nausea: Secondary | ICD-10-CM | POA: Diagnosis not present

## 2020-05-26 DIAGNOSIS — R52 Pain, unspecified: Secondary | ICD-10-CM | POA: Diagnosis not present

## 2020-05-26 DIAGNOSIS — S72001A Fracture of unspecified part of neck of right femur, initial encounter for closed fracture: Secondary | ICD-10-CM | POA: Diagnosis not present

## 2020-05-26 DIAGNOSIS — G319 Degenerative disease of nervous system, unspecified: Secondary | ICD-10-CM | POA: Diagnosis not present

## 2020-05-26 DIAGNOSIS — W19XXXA Unspecified fall, initial encounter: Secondary | ICD-10-CM | POA: Diagnosis not present

## 2020-05-26 DIAGNOSIS — Z20822 Contact with and (suspected) exposure to covid-19: Secondary | ICD-10-CM | POA: Diagnosis not present

## 2020-05-26 DIAGNOSIS — E785 Hyperlipidemia, unspecified: Secondary | ICD-10-CM | POA: Diagnosis not present

## 2020-05-26 DIAGNOSIS — I708 Atherosclerosis of other arteries: Secondary | ICD-10-CM | POA: Diagnosis not present

## 2020-05-26 DIAGNOSIS — S72141A Displaced intertrochanteric fracture of right femur, initial encounter for closed fracture: Secondary | ICD-10-CM | POA: Diagnosis not present

## 2020-05-26 DIAGNOSIS — S72101A Unspecified trochanteric fracture of right femur, initial encounter for closed fracture: Secondary | ICD-10-CM | POA: Diagnosis not present

## 2020-05-27 DIAGNOSIS — S72001A Fracture of unspecified part of neck of right femur, initial encounter for closed fracture: Secondary | ICD-10-CM | POA: Diagnosis not present

## 2020-05-27 DIAGNOSIS — Z9181 History of falling: Secondary | ICD-10-CM | POA: Diagnosis not present

## 2020-05-27 DIAGNOSIS — K5900 Constipation, unspecified: Secondary | ICD-10-CM | POA: Diagnosis not present

## 2020-05-27 DIAGNOSIS — D6489 Other specified anemias: Secondary | ICD-10-CM | POA: Diagnosis not present

## 2020-05-27 DIAGNOSIS — S72001D Fracture of unspecified part of neck of right femur, subsequent encounter for closed fracture with routine healing: Secondary | ICD-10-CM | POA: Diagnosis not present

## 2020-05-27 DIAGNOSIS — D649 Anemia, unspecified: Secondary | ICD-10-CM | POA: Diagnosis not present

## 2020-05-27 DIAGNOSIS — R791 Abnormal coagulation profile: Secondary | ICD-10-CM | POA: Diagnosis present

## 2020-05-27 DIAGNOSIS — Z6835 Body mass index (BMI) 35.0-35.9, adult: Secondary | ICD-10-CM | POA: Diagnosis not present

## 2020-05-27 DIAGNOSIS — E669 Obesity, unspecified: Secondary | ICD-10-CM | POA: Diagnosis present

## 2020-05-27 DIAGNOSIS — R339 Retention of urine, unspecified: Secondary | ICD-10-CM | POA: Diagnosis not present

## 2020-05-27 DIAGNOSIS — S72141A Displaced intertrochanteric fracture of right femur, initial encounter for closed fracture: Secondary | ICD-10-CM | POA: Diagnosis present

## 2020-05-27 DIAGNOSIS — I4819 Other persistent atrial fibrillation: Secondary | ICD-10-CM | POA: Diagnosis present

## 2020-05-27 DIAGNOSIS — K760 Fatty (change of) liver, not elsewhere classified: Secondary | ICD-10-CM | POA: Diagnosis present

## 2020-05-27 DIAGNOSIS — I10 Essential (primary) hypertension: Secondary | ICD-10-CM | POA: Diagnosis present

## 2020-05-27 DIAGNOSIS — R41841 Cognitive communication deficit: Secondary | ICD-10-CM | POA: Diagnosis not present

## 2020-05-27 DIAGNOSIS — R52 Pain, unspecified: Secondary | ICD-10-CM | POA: Diagnosis not present

## 2020-05-27 DIAGNOSIS — R338 Other retention of urine: Secondary | ICD-10-CM | POA: Diagnosis not present

## 2020-05-27 DIAGNOSIS — R279 Unspecified lack of coordination: Secondary | ICD-10-CM | POA: Diagnosis not present

## 2020-05-27 DIAGNOSIS — S0990XA Unspecified injury of head, initial encounter: Secondary | ICD-10-CM | POA: Diagnosis not present

## 2020-05-27 DIAGNOSIS — I739 Peripheral vascular disease, unspecified: Secondary | ICD-10-CM | POA: Diagnosis not present

## 2020-05-27 DIAGNOSIS — Z20822 Contact with and (suspected) exposure to covid-19: Secondary | ICD-10-CM | POA: Diagnosis present

## 2020-05-27 DIAGNOSIS — Z79899 Other long term (current) drug therapy: Secondary | ICD-10-CM | POA: Diagnosis not present

## 2020-05-27 DIAGNOSIS — E876 Hypokalemia: Secondary | ICD-10-CM | POA: Diagnosis not present

## 2020-05-27 DIAGNOSIS — I429 Cardiomyopathy, unspecified: Secondary | ICD-10-CM | POA: Diagnosis present

## 2020-05-27 DIAGNOSIS — I4891 Unspecified atrial fibrillation: Secondary | ICD-10-CM | POA: Diagnosis not present

## 2020-05-27 DIAGNOSIS — I5032 Chronic diastolic (congestive) heart failure: Secondary | ICD-10-CM | POA: Diagnosis not present

## 2020-05-27 DIAGNOSIS — E7800 Pure hypercholesterolemia, unspecified: Secondary | ICD-10-CM | POA: Diagnosis present

## 2020-05-27 DIAGNOSIS — Z8673 Personal history of transient ischemic attack (TIA), and cerebral infarction without residual deficits: Secondary | ICD-10-CM | POA: Diagnosis not present

## 2020-05-27 DIAGNOSIS — K219 Gastro-esophageal reflux disease without esophagitis: Secondary | ICD-10-CM | POA: Diagnosis present

## 2020-05-27 DIAGNOSIS — S72141D Displaced intertrochanteric fracture of right femur, subsequent encounter for closed fracture with routine healing: Secondary | ICD-10-CM | POA: Diagnosis not present

## 2020-05-27 DIAGNOSIS — R2689 Other abnormalities of gait and mobility: Secondary | ICD-10-CM | POA: Diagnosis not present

## 2020-05-27 DIAGNOSIS — Z7901 Long term (current) use of anticoagulants: Secondary | ICD-10-CM | POA: Diagnosis not present

## 2020-05-27 DIAGNOSIS — E785 Hyperlipidemia, unspecified: Secondary | ICD-10-CM | POA: Diagnosis present

## 2020-05-27 DIAGNOSIS — D500 Iron deficiency anemia secondary to blood loss (chronic): Secondary | ICD-10-CM | POA: Diagnosis not present

## 2020-05-27 DIAGNOSIS — M6281 Muscle weakness (generalized): Secondary | ICD-10-CM | POA: Diagnosis not present

## 2020-05-27 DIAGNOSIS — W19XXXA Unspecified fall, initial encounter: Secondary | ICD-10-CM | POA: Diagnosis not present

## 2020-05-27 DIAGNOSIS — R5381 Other malaise: Secondary | ICD-10-CM | POA: Diagnosis not present

## 2020-05-28 DIAGNOSIS — S72141A Displaced intertrochanteric fracture of right femur, initial encounter for closed fracture: Secondary | ICD-10-CM | POA: Diagnosis not present

## 2020-05-31 DIAGNOSIS — R52 Pain, unspecified: Secondary | ICD-10-CM | POA: Diagnosis not present

## 2020-05-31 DIAGNOSIS — R41841 Cognitive communication deficit: Secondary | ICD-10-CM | POA: Diagnosis not present

## 2020-05-31 DIAGNOSIS — S72141D Displaced intertrochanteric fracture of right femur, subsequent encounter for closed fracture with routine healing: Secondary | ICD-10-CM | POA: Diagnosis not present

## 2020-05-31 DIAGNOSIS — S72001A Fracture of unspecified part of neck of right femur, initial encounter for closed fracture: Secondary | ICD-10-CM | POA: Diagnosis not present

## 2020-05-31 DIAGNOSIS — I10 Essential (primary) hypertension: Secondary | ICD-10-CM | POA: Diagnosis not present

## 2020-05-31 DIAGNOSIS — Z23 Encounter for immunization: Secondary | ICD-10-CM | POA: Diagnosis not present

## 2020-05-31 DIAGNOSIS — I429 Cardiomyopathy, unspecified: Secondary | ICD-10-CM | POA: Diagnosis not present

## 2020-05-31 DIAGNOSIS — R279 Unspecified lack of coordination: Secondary | ICD-10-CM | POA: Diagnosis not present

## 2020-05-31 DIAGNOSIS — Z7901 Long term (current) use of anticoagulants: Secondary | ICD-10-CM | POA: Diagnosis not present

## 2020-05-31 DIAGNOSIS — R5381 Other malaise: Secondary | ICD-10-CM | POA: Diagnosis not present

## 2020-05-31 DIAGNOSIS — I5032 Chronic diastolic (congestive) heart failure: Secondary | ICD-10-CM | POA: Diagnosis not present

## 2020-05-31 DIAGNOSIS — R2689 Other abnormalities of gait and mobility: Secondary | ICD-10-CM | POA: Diagnosis not present

## 2020-05-31 DIAGNOSIS — M6281 Muscle weakness (generalized): Secondary | ICD-10-CM | POA: Diagnosis not present

## 2020-05-31 DIAGNOSIS — S72001D Fracture of unspecified part of neck of right femur, subsequent encounter for closed fracture with routine healing: Secondary | ICD-10-CM | POA: Diagnosis not present

## 2020-05-31 DIAGNOSIS — D500 Iron deficiency anemia secondary to blood loss (chronic): Secondary | ICD-10-CM | POA: Diagnosis not present

## 2020-05-31 DIAGNOSIS — M25551 Pain in right hip: Secondary | ICD-10-CM | POA: Diagnosis not present

## 2020-05-31 DIAGNOSIS — R339 Retention of urine, unspecified: Secondary | ICD-10-CM | POA: Diagnosis not present

## 2020-05-31 DIAGNOSIS — Z9181 History of falling: Secondary | ICD-10-CM | POA: Diagnosis not present

## 2020-05-31 DIAGNOSIS — M25561 Pain in right knee: Secondary | ICD-10-CM | POA: Diagnosis not present

## 2020-05-31 DIAGNOSIS — I4819 Other persistent atrial fibrillation: Secondary | ICD-10-CM | POA: Diagnosis not present

## 2020-06-05 DIAGNOSIS — S72001D Fracture of unspecified part of neck of right femur, subsequent encounter for closed fracture with routine healing: Secondary | ICD-10-CM | POA: Diagnosis not present

## 2020-06-05 DIAGNOSIS — I10 Essential (primary) hypertension: Secondary | ICD-10-CM | POA: Diagnosis not present

## 2020-06-05 DIAGNOSIS — D500 Iron deficiency anemia secondary to blood loss (chronic): Secondary | ICD-10-CM | POA: Diagnosis not present

## 2020-06-13 ENCOUNTER — Other Ambulatory Visit: Payer: Self-pay | Admitting: *Deleted

## 2020-06-14 ENCOUNTER — Other Ambulatory Visit: Payer: Self-pay | Admitting: *Deleted

## 2020-06-14 NOTE — Patient Outreach (Signed)
[facility] Post- Acute Care Coordinator follow up. Member screened for potential [facility] Care Management needs as a benefit of [place] Medicare.  Update received from [place] indicating member has a very supportive daughter and spouse. Transition plan is to return home. She must be able to transfer and walk short distances.   Will continue to follow for potential [facility] Care Management needs.    Marthenia Rolling, MSN-Ed, RN,BSN [place] Acute Care Coordinator [phone] [facility]) [phone]  (Toll free office)

## 2020-06-14 NOTE — Patient Outreach (Signed)
Member screened for potential [facility] Care Management needs as a benefit of [place] Medicare.  Per Patient [name] member resides in [facility].   Communication sent to [facility] SNF SW to collaborate about anticipated dc plans and potential [facility] Care Management needs.  Will continue to follow while member resides in SNF.   Marthenia Rolling, MSN-Ed, RN,BSN [place] Acute Care Coordinator [phone] [facility]) [phone]  (Toll free office)

## 2020-07-04 ENCOUNTER — Other Ambulatory Visit: Payer: Self-pay | Admitting: *Deleted

## 2020-07-04 NOTE — Patient Outreach (Signed)
[facility] Post- Acute Care Coordinator follow up. Member screened for potential [facility] Care Management needs as a benefit of [place] Medicare.  Per Patient [name] member resides in [facility].  Communication sent to [facility] SNF SWs to request update on anticipated transition plans and potential [facility] Care Management needs.   Marthenia Rolling, MSN, RN,BSN [place] Acute Care Coordinator [phone] [facility]) [phone]  (Toll free office)

## 2020-07-06 ENCOUNTER — Other Ambulatory Visit: Payer: Self-pay | Admitting: *Deleted

## 2020-07-06 NOTE — Patient Outreach (Signed)
[place] Coordinator follow up. Member screened for potential [facility] Care Management needs as a benefit of [place] Medicare.  Update received from [place] indicating member's transition plan remains to return home with spouse.   Will continue to follow for transition plans and potential [facility] Care Management needs.    Marthenia Rolling, MSN, RN,BSN [place] Acute Care Coordinator [phone] [facility]) [phone]  (Toll free office)

## 2020-07-24 DIAGNOSIS — S72001A Fracture of unspecified part of neck of right femur, initial encounter for closed fracture: Secondary | ICD-10-CM | POA: Diagnosis not present

## 2020-07-24 DIAGNOSIS — I10 Essential (primary) hypertension: Secondary | ICD-10-CM | POA: Diagnosis not present

## 2020-07-24 DIAGNOSIS — D500 Iron deficiency anemia secondary to blood loss (chronic): Secondary | ICD-10-CM | POA: Diagnosis not present

## 2020-08-06 DIAGNOSIS — Z7901 Long term (current) use of anticoagulants: Secondary | ICD-10-CM | POA: Diagnosis not present

## 2020-08-06 DIAGNOSIS — I4819 Other persistent atrial fibrillation: Secondary | ICD-10-CM | POA: Diagnosis not present

## 2020-08-06 DIAGNOSIS — I429 Cardiomyopathy, unspecified: Secondary | ICD-10-CM | POA: Diagnosis not present

## 2020-08-06 DIAGNOSIS — I10 Essential (primary) hypertension: Secondary | ICD-10-CM | POA: Diagnosis not present

## 2020-08-06 DIAGNOSIS — R2689 Other abnormalities of gait and mobility: Secondary | ICD-10-CM | POA: Diagnosis not present

## 2020-08-06 DIAGNOSIS — S72141D Displaced intertrochanteric fracture of right femur, subsequent encounter for closed fracture with routine healing: Secondary | ICD-10-CM | POA: Diagnosis not present

## 2020-08-06 DIAGNOSIS — Z9181 History of falling: Secondary | ICD-10-CM | POA: Diagnosis not present

## 2020-08-06 DIAGNOSIS — M6281 Muscle weakness (generalized): Secondary | ICD-10-CM | POA: Diagnosis not present

## 2020-08-06 DIAGNOSIS — R41841 Cognitive communication deficit: Secondary | ICD-10-CM | POA: Diagnosis not present

## 2020-08-06 DIAGNOSIS — I5032 Chronic diastolic (congestive) heart failure: Secondary | ICD-10-CM | POA: Diagnosis not present

## 2020-08-06 DIAGNOSIS — R339 Retention of urine, unspecified: Secondary | ICD-10-CM | POA: Diagnosis not present

## 2020-09-12 DIAGNOSIS — F331 Major depressive disorder, recurrent, moderate: Secondary | ICD-10-CM | POA: Diagnosis not present

## 2020-09-23 DIAGNOSIS — M810 Age-related osteoporosis without current pathological fracture: Secondary | ICD-10-CM | POA: Diagnosis not present

## 2020-09-23 DIAGNOSIS — I10 Essential (primary) hypertension: Secondary | ICD-10-CM | POA: Diagnosis not present

## 2020-09-23 DIAGNOSIS — E7849 Other hyperlipidemia: Secondary | ICD-10-CM | POA: Diagnosis not present

## 2020-09-23 DIAGNOSIS — S72001A Fracture of unspecified part of neck of right femur, initial encounter for closed fracture: Secondary | ICD-10-CM | POA: Diagnosis not present

## 2020-09-29 DIAGNOSIS — K8080 Other cholelithiasis without obstruction: Secondary | ICD-10-CM | POA: Diagnosis not present

## 2020-09-29 DIAGNOSIS — K5730 Diverticulosis of large intestine without perforation or abscess without bleeding: Secondary | ICD-10-CM | POA: Diagnosis not present

## 2020-09-29 DIAGNOSIS — K648 Other hemorrhoids: Secondary | ICD-10-CM | POA: Diagnosis not present

## 2020-09-29 DIAGNOSIS — Z90710 Acquired absence of both cervix and uterus: Secondary | ICD-10-CM | POA: Diagnosis not present

## 2020-09-29 DIAGNOSIS — I700 Atherosclerosis of aorta: Secondary | ICD-10-CM | POA: Diagnosis not present

## 2020-09-29 DIAGNOSIS — Z885 Allergy status to narcotic agent status: Secondary | ICD-10-CM | POA: Diagnosis not present

## 2020-09-29 DIAGNOSIS — I10 Essential (primary) hypertension: Secondary | ICD-10-CM | POA: Diagnosis not present

## 2020-09-29 DIAGNOSIS — K8020 Calculus of gallbladder without cholecystitis without obstruction: Secondary | ICD-10-CM | POA: Diagnosis not present

## 2020-09-29 DIAGNOSIS — K649 Unspecified hemorrhoids: Secondary | ICD-10-CM | POA: Diagnosis not present

## 2020-09-29 DIAGNOSIS — K5790 Diverticulosis of intestine, part unspecified, without perforation or abscess without bleeding: Secondary | ICD-10-CM | POA: Diagnosis not present

## 2020-09-29 DIAGNOSIS — I4891 Unspecified atrial fibrillation: Secondary | ICD-10-CM | POA: Diagnosis not present

## 2020-09-29 DIAGNOSIS — E785 Hyperlipidemia, unspecified: Secondary | ICD-10-CM | POA: Diagnosis not present

## 2020-09-29 DIAGNOSIS — Z79899 Other long term (current) drug therapy: Secondary | ICD-10-CM | POA: Diagnosis not present

## 2020-10-21 DIAGNOSIS — D6869 Other thrombophilia: Secondary | ICD-10-CM | POA: Diagnosis not present

## 2020-10-21 DIAGNOSIS — I110 Hypertensive heart disease with heart failure: Secondary | ICD-10-CM | POA: Diagnosis not present

## 2020-10-21 DIAGNOSIS — G8194 Hemiplegia, unspecified affecting left nondominant side: Secondary | ICD-10-CM | POA: Diagnosis not present

## 2020-10-21 DIAGNOSIS — I5032 Chronic diastolic (congestive) heart failure: Secondary | ICD-10-CM | POA: Diagnosis not present

## 2020-10-21 DIAGNOSIS — I340 Nonrheumatic mitral (valve) insufficiency: Secondary | ICD-10-CM | POA: Diagnosis not present

## 2020-10-21 DIAGNOSIS — E119 Type 2 diabetes mellitus without complications: Secondary | ICD-10-CM | POA: Diagnosis not present

## 2020-10-21 DIAGNOSIS — E7849 Other hyperlipidemia: Secondary | ICD-10-CM | POA: Diagnosis not present

## 2020-12-03 DIAGNOSIS — E7849 Other hyperlipidemia: Secondary | ICD-10-CM | POA: Diagnosis not present

## 2020-12-03 DIAGNOSIS — I110 Hypertensive heart disease with heart failure: Secondary | ICD-10-CM | POA: Diagnosis not present

## 2020-12-03 DIAGNOSIS — J9621 Acute and chronic respiratory failure with hypoxia: Secondary | ICD-10-CM | POA: Diagnosis not present

## 2020-12-03 DIAGNOSIS — I5032 Chronic diastolic (congestive) heart failure: Secondary | ICD-10-CM | POA: Diagnosis not present

## 2020-12-20 DIAGNOSIS — R609 Edema, unspecified: Secondary | ICD-10-CM | POA: Diagnosis not present

## 2020-12-20 DIAGNOSIS — I4891 Unspecified atrial fibrillation: Secondary | ICD-10-CM | POA: Diagnosis not present

## 2020-12-20 DIAGNOSIS — H9209 Otalgia, unspecified ear: Secondary | ICD-10-CM | POA: Diagnosis not present

## 2020-12-20 DIAGNOSIS — Z716 Tobacco abuse counseling: Secondary | ICD-10-CM | POA: Diagnosis not present

## 2020-12-22 DIAGNOSIS — D509 Iron deficiency anemia, unspecified: Secondary | ICD-10-CM | POA: Diagnosis not present

## 2020-12-22 DIAGNOSIS — D649 Anemia, unspecified: Secondary | ICD-10-CM | POA: Diagnosis not present

## 2020-12-22 DIAGNOSIS — D529 Folate deficiency anemia, unspecified: Secondary | ICD-10-CM | POA: Diagnosis not present

## 2020-12-22 DIAGNOSIS — D6869 Other thrombophilia: Secondary | ICD-10-CM | POA: Diagnosis not present

## 2020-12-22 DIAGNOSIS — E119 Type 2 diabetes mellitus without complications: Secondary | ICD-10-CM | POA: Diagnosis not present

## 2020-12-22 DIAGNOSIS — E782 Mixed hyperlipidemia: Secondary | ICD-10-CM | POA: Diagnosis not present

## 2020-12-22 DIAGNOSIS — D519 Vitamin B12 deficiency anemia, unspecified: Secondary | ICD-10-CM | POA: Diagnosis not present

## 2020-12-22 DIAGNOSIS — I4891 Unspecified atrial fibrillation: Secondary | ICD-10-CM | POA: Diagnosis not present

## 2020-12-22 DIAGNOSIS — E7849 Other hyperlipidemia: Secondary | ICD-10-CM | POA: Diagnosis not present

## 2020-12-22 DIAGNOSIS — R7301 Impaired fasting glucose: Secondary | ICD-10-CM | POA: Diagnosis not present

## 2020-12-27 ENCOUNTER — Encounter (HOSPITAL_COMMUNITY): Payer: Self-pay | Admitting: Internal Medicine

## 2020-12-27 ENCOUNTER — Inpatient Hospital Stay (HOSPITAL_COMMUNITY)
Admission: AD | Admit: 2020-12-27 | Discharge: 2021-01-01 | DRG: 378 | Disposition: A | Discharge status: Home / Self Care | Payer: Medicare Other | Source: Other Acute Inpatient Hospital | Attending: Family Medicine | Admitting: Family Medicine

## 2020-12-27 DIAGNOSIS — Z79899 Other long term (current) drug therapy: Secondary | ICD-10-CM | POA: Diagnosis not present

## 2020-12-27 DIAGNOSIS — Z825 Family history of asthma and other chronic lower respiratory diseases: Secondary | ICD-10-CM

## 2020-12-27 DIAGNOSIS — Z20822 Contact with and (suspected) exposure to covid-19: Secondary | ICD-10-CM | POA: Diagnosis not present

## 2020-12-27 DIAGNOSIS — I10 Essential (primary) hypertension: Secondary | ICD-10-CM | POA: Diagnosis not present

## 2020-12-27 DIAGNOSIS — K648 Other hemorrhoids: Secondary | ICD-10-CM | POA: Diagnosis present

## 2020-12-27 DIAGNOSIS — E782 Mixed hyperlipidemia: Secondary | ICD-10-CM | POA: Diagnosis present

## 2020-12-27 DIAGNOSIS — D62 Acute posthemorrhagic anemia: Secondary | ICD-10-CM | POA: Diagnosis present

## 2020-12-27 DIAGNOSIS — K649 Unspecified hemorrhoids: Secondary | ICD-10-CM | POA: Diagnosis not present

## 2020-12-27 DIAGNOSIS — I119 Hypertensive heart disease without heart failure: Secondary | ICD-10-CM | POA: Diagnosis present

## 2020-12-27 DIAGNOSIS — Z885 Allergy status to narcotic agent status: Secondary | ICD-10-CM | POA: Diagnosis not present

## 2020-12-27 DIAGNOSIS — Z8673 Personal history of transient ischemic attack (TIA), and cerebral infarction without residual deficits: Secondary | ICD-10-CM

## 2020-12-27 DIAGNOSIS — Z7901 Long term (current) use of anticoagulants: Secondary | ICD-10-CM

## 2020-12-27 DIAGNOSIS — I700 Atherosclerosis of aorta: Secondary | ICD-10-CM | POA: Diagnosis not present

## 2020-12-27 DIAGNOSIS — K922 Gastrointestinal hemorrhage, unspecified: Secondary | ICD-10-CM | POA: Diagnosis not present

## 2020-12-27 DIAGNOSIS — J9811 Atelectasis: Secondary | ICD-10-CM | POA: Diagnosis not present

## 2020-12-27 DIAGNOSIS — Z8249 Family history of ischemic heart disease and other diseases of the circulatory system: Secondary | ICD-10-CM

## 2020-12-27 DIAGNOSIS — Z90710 Acquired absence of both cervix and uterus: Secondary | ICD-10-CM | POA: Diagnosis not present

## 2020-12-27 DIAGNOSIS — J90 Pleural effusion, not elsewhere classified: Secondary | ICD-10-CM | POA: Diagnosis not present

## 2020-12-27 DIAGNOSIS — I2510 Atherosclerotic heart disease of native coronary artery without angina pectoris: Secondary | ICD-10-CM | POA: Diagnosis present

## 2020-12-27 DIAGNOSIS — K8020 Calculus of gallbladder without cholecystitis without obstruction: Secondary | ICD-10-CM | POA: Diagnosis not present

## 2020-12-27 DIAGNOSIS — I422 Other hypertrophic cardiomyopathy: Secondary | ICD-10-CM | POA: Diagnosis not present

## 2020-12-27 DIAGNOSIS — E669 Obesity, unspecified: Secondary | ICD-10-CM | POA: Diagnosis present

## 2020-12-27 DIAGNOSIS — K5731 Diverticulosis of large intestine without perforation or abscess with bleeding: Principal | ICD-10-CM | POA: Diagnosis present

## 2020-12-27 DIAGNOSIS — R188 Other ascites: Secondary | ICD-10-CM | POA: Diagnosis not present

## 2020-12-27 DIAGNOSIS — K625 Hemorrhage of anus and rectum: Secondary | ICD-10-CM | POA: Diagnosis not present

## 2020-12-27 DIAGNOSIS — D500 Iron deficiency anemia secondary to blood loss (chronic): Secondary | ICD-10-CM | POA: Diagnosis present

## 2020-12-27 DIAGNOSIS — E785 Hyperlipidemia, unspecified: Secondary | ICD-10-CM | POA: Diagnosis not present

## 2020-12-27 DIAGNOSIS — I4819 Other persistent atrial fibrillation: Secondary | ICD-10-CM | POA: Diagnosis present

## 2020-12-27 DIAGNOSIS — I4821 Permanent atrial fibrillation: Secondary | ICD-10-CM | POA: Diagnosis present

## 2020-12-27 DIAGNOSIS — K921 Melena: Secondary | ICD-10-CM | POA: Diagnosis present

## 2020-12-27 DIAGNOSIS — D649 Anemia, unspecified: Secondary | ICD-10-CM | POA: Diagnosis not present

## 2020-12-27 DIAGNOSIS — Z6832 Body mass index (BMI) 32.0-32.9, adult: Secondary | ICD-10-CM

## 2020-12-27 DIAGNOSIS — K8080 Other cholelithiasis without obstruction: Secondary | ICD-10-CM | POA: Diagnosis not present

## 2020-12-27 DIAGNOSIS — K5730 Diverticulosis of large intestine without perforation or abscess without bleeding: Secondary | ICD-10-CM | POA: Diagnosis not present

## 2020-12-27 LAB — CBC
HCT: 25.4 % — ABNORMAL LOW (ref 36.0–46.0)
Hemoglobin: 6.6 g/dL — CL (ref 12.0–15.0)
MCH: 17.4 pg — ABNORMAL LOW (ref 26.0–34.0)
MCHC: 26 g/dL — ABNORMAL LOW (ref 30.0–36.0)
MCV: 66.8 fL — ABNORMAL LOW (ref 80.0–100.0)
Platelets: 317 10*3/uL (ref 150–400)
RBC: 3.8 MIL/uL — ABNORMAL LOW (ref 3.87–5.11)
RDW: 21.8 % — ABNORMAL HIGH (ref 11.5–15.5)
WBC: 6.7 10*3/uL (ref 4.0–10.5)
nRBC: 0.6 % — ABNORMAL HIGH (ref 0.0–0.2)

## 2020-12-27 LAB — PREPARE RBC (CROSSMATCH)

## 2020-12-27 MED ORDER — ACETAMINOPHEN 325 MG PO TABS: 650.0000 mg | ORAL_TABLET | Freq: Four times a day (QID) | ORAL | Status: DC | PRN

## 2020-12-27 MED ORDER — ACETAMINOPHEN 650 MG RE SUPP: 650.0000 mg | Freq: Four times a day (QID) | RECTAL | Status: DC | PRN

## 2020-12-27 MED ORDER — ONDANSETRON HCL 4 MG PO TABS: 4.0000 mg | ORAL_TABLET | Freq: Four times a day (QID) | ORAL | Status: DC | PRN

## 2020-12-27 MED ORDER — LACTATED RINGERS IV SOLN: INTRAVENOUS | Status: DC

## 2020-12-27 MED ORDER — PANTOPRAZOLE SODIUM 40 MG PO TBEC
40.0000 mg | DELAYED_RELEASE_TABLET | Freq: Every day | ORAL | Status: DC
  Administered 2020-12-28 – 2021-01-01 (×5): 40 mg via ORAL
  Filled 2020-12-27 (×5): qty 1

## 2020-12-27 MED ORDER — DIPHENHYDRAMINE HCL 25 MG PO CAPS
25.0000 mg | ORAL_CAPSULE | Freq: Once | ORAL | Status: AC
  Administered 2020-12-28: 25 mg via ORAL
  Filled 2020-12-27: qty 1

## 2020-12-27 MED ORDER — FUROSEMIDE 10 MG/ML IJ SOLN: 20.0000 mg | Freq: Once | INTRAMUSCULAR | Status: DC

## 2020-12-27 MED ORDER — SODIUM CHLORIDE 0.9% IV SOLUTION: Freq: Once | INTRAVENOUS | Status: AC

## 2020-12-27 MED ORDER — ONDANSETRON HCL 4 MG/2ML IJ SOLN: 4.0000 mg | Freq: Four times a day (QID) | INTRAMUSCULAR | Status: DC | PRN

## 2020-12-27 MED ORDER — POTASSIUM CHLORIDE CRYS ER 20 MEQ PO TBCR
20.0000 meq | EXTENDED_RELEASE_TABLET | Freq: Every day | ORAL | Status: DC
  Administered 2020-12-28 – 2021-01-01 (×5): 20 meq via ORAL
  Filled 2020-12-27 (×5): qty 1

## 2020-12-27 MED ORDER — DILTIAZEM HCL ER COATED BEADS 120 MG PO CP24
120.0000 mg | ORAL_CAPSULE | Freq: Every day | ORAL | Status: DC
  Administered 2020-12-28 – 2021-01-01 (×5): 120 mg via ORAL
  Filled 2020-12-27 (×5): qty 1

## 2020-12-27 MED ORDER — PRAVASTATIN SODIUM 20 MG PO TABS
20.0000 mg | ORAL_TABLET | Freq: Every evening | ORAL | Status: DC
  Administered 2020-12-28 – 2020-12-31 (×5): 20 mg via ORAL
  Filled 2020-12-27 (×5): qty 1

## 2020-12-27 MED ORDER — ACETAMINOPHEN 325 MG PO TABS
650.0000 mg | ORAL_TABLET | Freq: Once | ORAL | Status: AC
  Administered 2020-12-28: 650 mg via ORAL
  Filled 2020-12-27: qty 2

## 2020-12-27 MED ORDER — FUROSEMIDE 40 MG PO TABS
40.0000 mg | ORAL_TABLET | Freq: Two times a day (BID) | ORAL | Status: DC
  Administered 2020-12-28 – 2021-01-01 (×9): 40 mg via ORAL
  Filled 2020-12-27 (×9): qty 1

## 2020-12-27 NOTE — H&P (Signed)
History and Physical    [name] MPN:[id number] DOB: 04/02/1946 DOA: 12/27/2020  PCP: Caryl Bis, MD   Patient coming from:  Home [facility]  Chief Complaint: blood from rectum, anemia  HPI: [name] is a 74 y.o. female with medical history significant for atrial fibrillation, HTN, [name], hx of CVA who presented to ER with blood per rectum. Is on Eliquis for a-fib. A few days ago was seen by her PCP and had a low Hgb of 6.8 and was instructed to stop eliquis and follow up with GI. She did not go to GI. She developed fatigue and SOB with excertion and her husband took her to the ER. Hgb found to be 6.1. Pt is confused and does not remember if she stopped her the eliquis. She was given Kcentra in the ER. Given one unit PRBC in ER. She was admitted a few years ago for GI bleed and at that time had diverticular bleed vs hemoroid bleed per records sent from [facility]. Has not seen GI since then.  Pt is confused and doesn't have clear recollection of events of today. No report of fever, chills, nausea, vomting, diarrhea. Had a CTA of abdomen and pelvis at ER which did not show any active bleed.  No bleeding at this time.   Review of Systems:  General: Reports generalized fatigue. Denies  fever, chills, weight loss, night sweats.Denies dizziness. Denies change in appetite HENT: Denies head trauma, headache, denies change in hearing, tinnitus. Denies nasal bleeding. Denies sore throat, sores in mouth. Denies difficulty swallowing Eyes: Denies blurry vision, pain in eye, drainage.  Denies discoloration of eyes. Neck: Denies pain.  Denies swelling.  Denies pain with movement. Cardiovascular: Denies chest pain, palpitations. Denies edema. Denies orthopnea Respiratory: Has shortness of breath with exertion. Denies cough.  Denies wheezing.  Denies sputum production Gastrointestinal: Has bright red blood from rectum. Denies abdominal pain, swelling.  Denies nausea,  vomiting, diarrhea.  Denies melena.  Denies hematemesis.  Musculoskeletal: Denies limitation of movement.  Denies deformity or swelling.  Denies pain.  Denies arthralgias or myalgias. Genitourinary: Denies pelvic pain.  Denies urinary frequency or hesitancy.  Denies dysuria.  Skin: Denies rash.  Denies petechiae, purpura, ecchymosis. Neurological: Denies headache. Denies syncope. Denies seizure activity. Denies paresthesia. Denies slurred speech, drooping face.    Denies visual change. Psychiatric: Denies depression, anxiety. Denies hallucinations.  Past Medical History:  Diagnosis Date  . [name] (coronary artery disease) 2008   mild  . Dysrhythmia    AFib  . Hypertensive cardiovascular disease   . Left atrial dilatation 2013  . Obesity   . Persistent atrial fibrillation ([place])   . 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 ABDOMINAL HYSTERECTOMY    . ABDOMINAL HYSTERECTOMY    . BIOPSY THYROID     x2  . CARDIOVERSION N/A 07/22/2014   Procedure: CARDIOVERSION;  Surgeon: Sueanne Margarita, MD;  Location: [facility] ENDOSCOPY;  Service: Cardiovascular;  Laterality: N/A;  . CATARACT EXTRACTION W/PHACO Left 05/19/2018   Procedure: CATARACT EXTRACTION PHACO AND INTRAOCULAR LENS PLACEMENT ([place]);  Surgeon: Tonny Branch, MD;  Location: [facility] ORS;  Service: Ophthalmology;  Laterality: Left;  CDE: 12.99  . CATARACT EXTRACTION W/PHACO Right 06/02/2018   Procedure: CATARACT EXTRACTION PHACO AND INTRAOCULAR LENS PLACEMENT RIGHT EYE CDE=7.18;  Surgeon: Tonny Branch, MD;  Location: [facility] ORS;  Service: Ophthalmology;  Laterality: Right;  right  .  LEFT HEART CATHETERIZATION WITH CORONARY ANGIOGRAM N/A 07/19/2014   Procedure: LEFT HEART CATHETERIZATION WITH CORONARY ANGIOGRAM;  Surgeon: Blane Ohara, MD;  Location: [facility] CATH LAB;  Service: Cardiovascular;  Laterality: N/A;  . TEE WITHOUT CARDIOVERSION N/A 07/22/2014   Procedure: TRANSESOPHAGEAL  ECHOCARDIOGRAM (TEE);  Surgeon: Sueanne Margarita, MD;  Location: [facility] ENDOSCOPY;  Service: Cardiovascular;  Laterality: N/A;  . THYROIDECTOMY, PARTIAL      Social History  reports that she has never smoked. She has never used smokeless tobacco. She reports that she does not drink alcohol and does not use drugs.  Allergies  Allergen Reactions  . Codeine Other (See Comments)    REACTION: Hallucinations    Family History  Problem Relation Age of Onset  . Hypertension Other   . Emphysema Mother        deceased age 71   . Colon cancer Neg Hx   . Colon polyps Neg Hx      Prior to Admission medications   Medication Sig Start Date End Date Taking? Authorizing Provider  Ascorbic Acid (VITAMIN C) 1000 MG tablet Take 500 mg by mouth 2 (two) times daily.     [provider]  Calcium Carb-Cholecalciferol (CALCIUM + D3) 600-200 MG-UNIT TABS Take 1 tablet by mouth daily.    [provider]  cholecalciferol (VITAMIN D) 1000 UNITS tablet Take 2,000 Units by mouth daily.     [provider]  Coenzyme Q10 (COQ-10) 100 MG CAPS Take 1 capsule by mouth daily.    [provider]  diltiazem (DILTIAZEM CD) 120 MG 24 hr capsule Take 120 mg by mouth daily.    [provider]  ELIQUIS 5 MG TABS tablet TAKE 1 TABLET BY MOUTH TWICE DAILY. 05/13/19   Herminio Commons, MD  furosemide (LASIX) 40 MG tablet Take 40 mg by mouth 2 (two) times daily.    [provider]  glucosamine-chondroitin 500-400 MG tablet Take 1 tablet by mouth 2 (two) times daily.     [provider]  metoprolol tartrate (LOPRESSOR) 25 MG tablet TAKE 1&1/2 TABLETS BY MOUTH TWICE DAILY 11/14/18   Herminio Commons, MD  pantoprazole (PROTONIX) 40 MG tablet Take 40 mg by mouth daily.  01/13/18   [provider]  potassium chloride SA (K-DUR) 20 MEQ tablet TAKE (1) TABLET BY MOUTH ONCE DAILY. 04/17/19   Herminio Commons, MD  pravastatin (PRAVACHOL) 20 MG tablet Take 20 mg by  mouth every evening.  01/11/18   [provider]    Physical Exam: Vitals:   12/27/20 2120 12/27/20 2129  BP:  120/74  Pulse:  82  Resp:  18  Temp:  98.3 F (36.8 C)  TempSrc:  Oral  SpO2:  97%  Weight: 85.9 kg   Height: 5\' 4"  (1.626 m)     Constitutional: NAD, calm, comfortable Vitals:   12/27/20 2120 12/27/20 2129  BP:  120/74  Pulse:  82  Resp:  18  Temp:  98.3 F (36.8 C)  TempSrc:  Oral  SpO2:  97%  Weight: 85.9 kg   Height: 5\' 4"  (1.626 m)    General: WDWN, Alert and oriented x3.  Eyes: EOMI, PERRL, conjunctivae pale pink.  Sclera nonicteric HENT:  [place]/AT, external ears normal.  Nares patent without epistasis.  Mucous membranes are moist. Posterior pharynx clear of any exudate or lesions.  Neck: Soft, normal range of motion, supple, no masses, no thyromegaly. Trachea midline Respiratory: clear to auscultation bilaterally, no wheezing, no crackles.  Normal respiratory effort. No accessory muscle use.  Cardiovascular: Irregularly irregular rhythm with normal rate, no murmurs / rubs / gallops. No extremity edema. 2+ pedal pulses. Abdomen: Soft, no tenderness, nondistended, no rebound or guarding. No masses palpated. Bowel sounds normoactive. Small external hemorroids noted but no active bleeding. Small internal hemorroid palpated on exam, no blood in rectal vault. RN in room to chaperone rectal exam Musculoskeletal: FROM. no cyanosis. No joint deformity upper and lower extremities. Normal muscle tone.  Skin: Warm, dry, intact no rashes, lesions, ulcers. No induration Neurologic: CN 2-12 grossly intact. Normal speech. Sensation intact, Strength 5/5 in all extremities.   Psychiatric: Normal judgment and insight. Normal mood.    Labs on Admission:  Done at [facility] and in bedside chart on unit.  CBC ordered and pending  Assessment/Plan Principal Problem:   Lower GI bleed Ms. Roettger is admitted to Telemetry floor.  Pt with lower GI bleed with bright red  blood this am. Given one unit of PRBC at [facility] ER this afternoon before transfer to [place] in ER. Recheck CBC now and if Hgb still under 8 will transfuse another unit of PRBC.  Consult [facility] in am. Secure chat sent to Dr. Alessandra Bevels  Active Problems:   Anemia due to blood loss Monitor Hgb/Hct. Transfuse as needed.     Essential hypertension, benign Continue diltiazem. Pharmacy to reconcile home medications. Monitor BP.     Persistent atrial fibrillation  Continue cardizem. Home medications will be reconciled by pharmacy and will resume as indicated.  Patient is unsure of medication names she takes.  List mentions Cardizem and metoprolol but she does not remember if she is taking metoprolol Eliquis has been held.     DVT prophylaxis: Ms. Tyer has been on Eliquis which is held. Given SCDs for DVT prophylaxis.  Code Status:   Full Code Family Communication:  Diagnosis and plan discussed with patient and her daughter who is at bedside.  Questions answered.  They both verbalized understanding agree with plan.  Further recommendations to follow as clinical indicated Disposition Plan:   Patient is from:  Home, Transferred from [facility] emergency room  Anticipated DC to:  Home  Anticipated DC date:  Anticipate 2 midnight or more stay in the hospital to treat acute condition  Anticipated DC barriers: No barriers to discharge identified at this time  Consults called:  Gastroenterology-Dr. Alessandra Bevels with [facility] sent secure chat Admission status:  Inpatient  Yevonne Aline Evander Macaraeg MD [facility]  How to contact the [facility] Attending or Consulting provider [place] or covering provider during after hours [place], for this patient?   1. Check the care team in [facility] and look for a) attending/consulting [facility] provider listed and b) the [facility] team listed 2. Log into [email or web address] and use [place]'s universal password to access. If you do not have  the password, please contact the hospital operator. 3. Locate the [facility] provider you are looking for under [facility] and page to a number that you can be directly reached. 4. If you still have difficulty reaching the provider, please page the [facility] (Director on Call) for the Hospitalists listed on amion for assistance.  12/27/2020, 10:22 PM     Addendum: Hgb is 6.6 now. Will transfuse two units of PRBC.

## 2020-12-28 DIAGNOSIS — K922 Gastrointestinal hemorrhage, unspecified: Secondary | ICD-10-CM

## 2020-12-28 LAB — HEMOGLOBIN AND HEMATOCRIT, BLOOD
HCT: 31.6 % — ABNORMAL LOW (ref 36.0–46.0)
Hemoglobin: 8.8 g/dL — ABNORMAL LOW (ref 12.0–15.0)

## 2020-12-28 LAB — CBC
HCT: 25.6 % — ABNORMAL LOW (ref 36.0–46.0)
Hemoglobin: 6.6 g/dL — CL (ref 12.0–15.0)
MCH: 17.3 pg — ABNORMAL LOW (ref 26.0–34.0)
MCHC: 25.8 g/dL — ABNORMAL LOW (ref 30.0–36.0)
MCV: 67 fL — ABNORMAL LOW (ref 80.0–100.0)
Platelets: 284 10*3/uL (ref 150–400)
RBC: 3.82 MIL/uL — ABNORMAL LOW (ref 3.87–5.11)
RDW: 21.9 % — ABNORMAL HIGH (ref 11.5–15.5)
WBC: 5.7 10*3/uL (ref 4.0–10.5)
nRBC: 0 % (ref 0.0–0.2)

## 2020-12-28 LAB — COMPREHENSIVE METABOLIC PANEL
ALT: 18 U/L (ref 0–44)
AST: 31 U/L (ref 15–41)
Albumin: 2.8 g/dL — ABNORMAL LOW (ref 3.5–5.0)
Alkaline Phosphatase: 55 U/L (ref 38–126)
Anion gap: 8 (ref 5–15)
BUN: 11 mg/dL (ref 8–23)
CO2: 26 mmol/L (ref 22–32)
Calcium: 8.4 mg/dL — ABNORMAL LOW (ref 8.9–10.3)
Chloride: 104 mmol/L (ref 98–111)
Creatinine, Ser: 0.64 mg/dL (ref 0.44–1.00)
GFR, Estimated: >60 mL/min (ref >60)
Glucose, Bld: 91 mg/dL (ref 70–99)
Potassium: 3.8 mmol/L (ref 3.5–5.1)
Sodium: 138 mmol/L (ref 135–145)
Total Bilirubin: 1.7 mg/dL — ABNORMAL HIGH (ref 0.3–1.2)
Total Protein: 5.7 g/dL — ABNORMAL LOW (ref 6.5–8.1)

## 2020-12-28 LAB — ABO/RH: ABO/RH(D): O POS

## 2020-12-28 MED ORDER — METOPROLOL TARTRATE 25 MG PO TABS
37.5000 mg | ORAL_TABLET | Freq: Two times a day (BID) | ORAL | Status: DC
  Administered 2020-12-28 – 2021-01-01 (×9): 37.5 mg via ORAL
  Filled 2020-12-28 (×8): qty 2

## 2020-12-28 NOTE — Progress Notes (Signed)
PROGRESS NOTE    [name]  SWN:[id number] DOB: Dec 10, 1945 DOA: 12/27/2020 PCP: Caryl Bis, MD   Brief Narrative:  HPI: [name] is a 75 y.o. female with medical history significant for atrial fibrillation, HTN, [name], hx of CVA who presented to ER with blood per rectum. Is on Eliquis for a-fib. A few days ago was seen by her PCP and had a low Hgb of 6.8 and was instructed to stop eliquis and follow up with GI. She did not go to GI. She developed fatigue and SOB with excertion and her husband took her to the ER. Hgb found to be 6.1. Pt is confused and does not remember if she stopped her the eliquis. She was given Kcentra in the ER. Given one unit PRBC in ER. She was admitted a few years ago for GI bleed and at that time had diverticular bleed vs hemoroid bleed per records sent from [facility]. Has not seen GI since then.  Pt is confused and doesn't have clear recollection of events of today. No report of fever, chills, nausea, vomting, diarrhea. Had a CTA of abdomen and pelvis at ER which did not show any active bleed.  No bleeding at this time.   Assessment & Plan:   Principal Problem:   Lower GI bleed Active Problems:   Essential hypertension, benign   Persistent atrial fibrillation (HCC)   Anemia due to blood loss   Acute blood loss anemia secondary to bright red blood per rectum/lower GI bleeding: Reportedly, patient's hemoglobin was 6.1 at outside ER for which she was transfused 1 unit of PRBC.  Her hemoglobin here is 6.5.  2 units of PRBC for transfusion have been ordered but not initiated yet.  We will check hemoglobin after transfusion.  GI is consulted.  I discussed with Dr. Cristina Gong.  He will see patient soon.  Patient remains n.p.o. for anticipation of any procedures.  Essential hypertension: Blood pressure controlled.  Continue home medications which includes diltiazem and metoprolol.  Permanent atrial fibrillation: Rate is controlled, continue metoprolol  and diltiazem.  Continue to hold Eliquis.  Mixed hyperlipidemia: Continue pravastatin.  DVT prophylaxis: SCDs Start: 12/27/20 2219   Code Status: Full Code  Family Communication: None present at bedside.  Plan of care discussed with patient in length and he verbalized understanding and agreed with it.  Status is: Inpatient  Remains inpatient appropriate because:Ongoing diagnostic testing needed not appropriate for outpatient work up   Dispo: The patient is from: Home              Anticipated d/c is to: Home              Patient currently is not medically stable to d/c.   Difficult to place patient No        Estimated body mass index is 32.51 kg/m as calculated from the following:   Height as of this encounter: 5\' 4"  (1.626 m).   Weight as of this encounter: 85.9 kg.      Nutritional status:               Consultants:   GI  Procedures:   None  Antimicrobials:  Anti-infectives (From admission, onward)   None         Subjective: Patient seen and examined.  She has no complaints.  She is alert and oriented.  According to her, she has not had any BM since admission and no rectal bleeding as well.  Objective: Vitals:  12/27/20 2120 12/27/20 2129 12/28/20 0140 12/28/20 0614  BP:  120/74 127/65 134/90  Pulse:  82 76 87  Resp:  18 18 20   Temp:  98.3 F (36.8 C) 97.7 F (36.5 C) (!) 97.5 F (36.4 C)  TempSrc:  Oral Oral   SpO2:  97% 100% 100%  Weight: 85.9 kg     Height: 5\' 4"  (1.626 m)       Intake/Output Summary (Last 24 hours) at 12/28/2020 0903 Last data filed at 12/28/2020 0200 Gross per 24 hour  Intake 194.28 ml  Output --  Net 194.28 ml   Filed Weights   12/27/20 2120  Weight: 85.9 kg    Examination:  General exam: Appears calm and comfortable  Respiratory system: Clear to auscultation. Respiratory effort normal. Cardiovascular system: S1 & S2 heard, RRR. No JVD, murmurs, rubs, gallops or clicks. No pedal  edema. Gastrointestinal system: Abdomen is nondistended, soft and nontender. No organomegaly or masses felt. Normal bowel sounds heard. Central nervous system: Alert and oriented. No focal neurological deficits. Extremities: Symmetric 5 x 5 power. Skin: No rashes, lesions or ulcers Psychiatry: Judgement and insight appear normal. Mood & affect appropriate.    Data Reviewed: I have personally reviewed following labs and imaging studies  CBC: Recent Labs  Lab 12/27/20 2241 12/28/20 0452  WBC 6.7 5.7  HGB 6.6* 6.6*  HCT 25.4* 25.6*  MCV 66.8* 67.0*  PLT 317 213   Basic Metabolic Panel: Recent Labs  Lab 12/28/20 0452  NA 138  K 3.8  CL 104  CO2 26  GLUCOSE 91  BUN 11  CREATININE 0.64  CALCIUM 8.4*   GFR: Estimated Creatinine Clearance: 64.5 mL/min (by C-G formula based on SCr of 0.64 mg/dL). Liver Function Tests: Recent Labs  Lab 12/28/20 0452  AST 31  ALT 18  ALKPHOS 55  BILITOT 1.7*  PROT 5.7*  ALBUMIN 2.8*   No results for input(s): LIPASE, AMYLASE in the last 168 hours. No results for input(s): AMMONIA in the last 168 hours. Coagulation Profile: No results for input(s): INR, PROTIME in the last 168 hours. Cardiac Enzymes: No results for input(s): CKTOTAL, CKMB, CKMBINDEX, TROPONINI in the last 168 hours. BNP (last 3 results) No results for input(s): PROBNP in the last 8760 hours. HbA1C: No results for input(s): HGBA1C in the last 72 hours. CBG: No results for input(s): GLUCAP in the last 168 hours. Lipid Profile: No results for input(s): CHOL, HDL, LDLCALC, TRIG, CHOLHDL, LDLDIRECT in the last 72 hours. Thyroid Function Tests: No results for input(s): TSH, T4TOTAL, FREET4, T3FREE, THYROIDAB in the last 72 hours. Anemia Panel: No results for input(s): VITAMINB12, FOLATE, FERRITIN, TIBC, IRON, RETICCTPCT in the last 72 hours. Sepsis Labs: No results for input(s): PROCALCITON, LATICACIDVEN in the last 168 hours.  No results found for this or any  previous visit (from the past 240 hour(s)).    Radiology Studies: No results found.  Scheduled Meds: . acetaminophen  650 mg Oral Once  . diltiazem  120 mg Oral Daily  . diphenhydrAMINE  25 mg Oral Once  . furosemide  20 mg Intravenous Once  . furosemide  40 mg Oral BID  . pantoprazole  40 mg Oral Daily  . potassium chloride SA  20 mEq Oral Daily  . pravastatin  20 mg Oral QPM   Continuous Infusions: . lactated ringers 75 mL/hr at 12/28/20 0200     LOS: 1 day   Time spent: 34 minutes   Darliss Cheney, MD [facility]  12/28/2020, 9:03 AM   How to contact the [facility] Attending or Consulting provider [place] or covering provider during after hours [place], for this patient?  1. Check the care team in [facility] and look for a) attending/consulting [facility] provider listed and b) the [facility] team listed. Page or secure chat [phone]. 2. Log into [email or web address] and use [place]'s universal password to access. If you do not have the password, please contact the hospital operator. 3. Locate the [facility] provider you are looking for under [facility] and page to a number that you can be directly reached. 4. If you still have difficulty reaching the provider, please page the [facility] (Director on Call) for the Hospitalists listed on amion for assistance.

## 2020-12-28 NOTE — Consult Note (Signed)
Referring Provider: Dr. Tonie Griffith [facility]) Primary Care Physician:  Caryl Bis, MD Primary Gastroenterologist:  Althia Forts  Reason for Consultation:  Lower GI bleeding  HPI: [name] is a 75 y.o. female with past medical history of A fib (on Eliquis), diverticular bleeding, CVA, [name], and HTN presenting for consultation of lower GI bleeding.  Patient presented from outside ED with weakness and was found to be anemic with Hgb 6.1. She was given 1u pRBCs with inadequate rise to 6.6.   She reports recent rectal bleeding but states she has not had any bleeding for the last 1-2 days.  Per RN, no bleeding overnight or this morning thus far.  Reports intermittent BRBPR and states it varies from large volume to small volume.  Denies any associated abdominal pain.  Further denies nausea, vomiting, changes in appetite, early satiety, or unexplained weight loss.  She is on Eliquis, she cannot recall if last dose was yesterday or the day prior.  Denies ASA or NSAID use. Denies family history of colon cancer or gastrointestinal malignancy.  She has a history of lower GI bleeding, presumably diverticular bleeding, last occurring in 09/2018 (at which point she had hemorrhagic shock with hemoglobin 4.2).  She underwent both EGD and colonoscopy.  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  EGD revealed small hiatal hernia, otherwise normal.   Past Medical History:  Diagnosis Date  . [name] (coronary artery disease) 2008   mild  . Dysrhythmia    AFib  . Hypertensive cardiovascular disease   . Left atrial dilatation 2013  . Obesity   . Persistent atrial fibrillation ([place])   . 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 ABDOMINAL HYSTERECTOMY    . ABDOMINAL HYSTERECTOMY    . BIOPSY THYROID     x2  . CARDIOVERSION N/A 07/22/2014   Procedure: CARDIOVERSION;   Surgeon: Sueanne Margarita, MD;  Location: [facility] ENDOSCOPY;  Service: Cardiovascular;  Laterality: N/A;  . CATARACT EXTRACTION W/PHACO Left 05/19/2018   Procedure: CATARACT EXTRACTION PHACO AND INTRAOCULAR LENS PLACEMENT ([place]);  Surgeon: Tonny Branch, MD;  Location: [facility] ORS;  Service: Ophthalmology;  Laterality: Left;  CDE: 12.99  . CATARACT EXTRACTION W/PHACO Right 06/02/2018   Procedure: CATARACT EXTRACTION PHACO AND INTRAOCULAR LENS PLACEMENT RIGHT EYE CDE=7.18;  Surgeon: Tonny Branch, MD;  Location: [facility] ORS;  Service: Ophthalmology;  Laterality: Right;  right  . LEFT HEART CATHETERIZATION WITH CORONARY ANGIOGRAM N/A 07/19/2014   Procedure: LEFT HEART CATHETERIZATION WITH CORONARY ANGIOGRAM;  Surgeon: Blane Ohara, MD;  Location[facility] C[facility] LAB;  Service: Cardiovascular;  Laterality: N/A;  . TEE WITHOUT CARDIOVERSION N/A 07/22/2014   Procedure: TRANSESOPHAGEAL ECHOCARDIOGRAM (TEE);  Surgeon: Sueanne Margarita, MD;  Location: [facility] ENDOSCOPY;  Service: Cardiovascular;  Laterality: N/A;  . THYROIDECTOMY, PARTIAL      Prior to Admission medications   Medication Sig Start Date End Date Taking? Authorizing Provider  alendronate (FOSAMAX) 70 MG tablet Take 70 mg by mouth once a week. 12/08/20  Yes [provider]  CALCIUM + VITAMIN D3 600-10 MG-MCG TABS Take 1 tablet by mouth 2 (two) times daily. 12/08/20  Yes [provider]  Cholecalciferol (VITAMIN D3) 25 MCG (1000 UT) CAPS Take 1,000 Units by mouth daily. 12/08/20  Yes [provider]  Coenzyme Q10 (COQ-10) 100 MG CAPS Take 100 mg by mouth daily.   Yes [provider]  diltiazem (CARDIZEM CD) 120 MG 24 hr capsule Take 120 mg by mouth daily.   Yes [provider]  ELIQUIS 5 MG TABS tablet TAKE 1 TABLET BY MOUTH TWICE DAILY. Patient taking differently: Take 5 mg by mouth 2 (two) times daily. 05/13/19  Yes Herminio Commons, MD  furosemide (LASIX) 40 MG tablet Take 40 mg by mouth 2 (two) times daily.   Yes [provider]  metoprolol tartrate (LOPRESSOR) 25 MG tablet TAKE 1&1/2 TABLETS BY MOUTH TWICE DAILY Patient taking differently: Take 37.5 mg by mouth 2 (two) times daily. 11/14/18  Yes Herminio Commons, MD  pantoprazole (PROTONIX) 40 MG tablet Take 40 mg by mouth daily.  01/13/18  Yes [provider]  potassium chloride SA (K-DUR) 20 MEQ tablet TAKE (1) TABLET BY MOUTH ONCE DAILY. Patient taking differently: Take 40 mEq by mouth daily. 04/17/19  Yes Herminio Commons, MD  pravastatin (PRAVACHOL) 20 MG tablet Take 20 mg by mouth every evening.  01/11/18  Yes [provider]  sertraline (ZOLOFT) 50 MG tablet Take 50 mg by mouth daily. 12/02/20  Yes [provider]    Scheduled Meds: . acetaminophen  650 mg Oral Once  . diltiazem  120 mg Oral Daily  . diphenhydrAMINE  25 mg Oral Once  . furosemide  20 mg Intravenous Once  . furosemide  40 mg Oral BID  . pantoprazole  40 mg Oral Daily  . potassium chloride SA  20 mEq Oral Daily  . pravastatin  20 mg Oral QPM   Continuous Infusions: . lactated ringers 75 mL/hr at 12/28/20 0200   PRN Meds:.acetaminophen **OR** acetaminophen, ondansetron **OR** ondansetron (ZOFRAN) IV  Allergies as of 12/27/2020 - Review Complete 12/27/2020  Allergen Reaction Noted  . Codeine Other (See Comments)     Family History  Problem Relation Age of Onset  . Hypertension Other   . Emphysema Mother        deceased age 9   . Colon cancer Neg Hx   . Colon polyps Neg Hx     Social History   Socioeconomic History  . Marital status: Married    Spouse name: Not on file  . Number of children: Not on file  . Years of education: Not on file  . Highest education level: Not on file  Occupational History  . Occupation: Retired  Tobacco Use  . Smoking status: Never Smoker  . Smokeless tobacco: Never Used  Vaping Use  . Vaping Use: Never used  Substance and Sexual Activity  . Alcohol use: No    Alcohol/week: 0.0 standard drinks   . Drug use: No  . Sexual activity: Yes    Birth control/protection: Surgical  Other Topics Concern  . Not on file  Social History Narrative   Lives in [place]   Retired   Social Determinants of Health   Financial Resource Strain: Not on file  Food Insecurity: Not on file  Transportation Needs: Not on file  Physical Activity: Not on file  Stress: Not on file  Social Connections: Not on file  Intimate Partner Violence: Not on file    Review of Systems:Review of Systems  Constitutional: Positive for malaise/fatigue. Negative for weight loss.  HENT: Negative for hearing loss and tinnitus.   Eyes: Negative for pain and redness.  Respiratory: Negative for cough and shortness of breath.   Cardiovascular: Negative for chest pain and palpitations.  Gastrointestinal: Positive for blood in stool. Negative for abdominal pain, constipation, diarrhea, heartburn,  melena, nausea and vomiting.  Genitourinary: Negative for flank pain and hematuria.  Musculoskeletal: Negative for falls and joint pain.  Skin: Negative for itching and rash.  Neurological: Positive for weakness. Negative for loss of consciousness.  Endo/Heme/Allergies: Negative for polydipsia. Does not bruise/bleed easily.  Psychiatric/Behavioral: Negative for substance abuse. The patient is not nervous/anxious.      Physical Exam: Vital signs: Vitals:   12/28/20 0140 12/28/20 0614  BP: 127/65 134/90  Pulse: 76 87  Resp: 18 20  Temp: 97.7 F (36.5 C) (!) 97.5 F (36.4 C)  SpO2: 100% 100%   Last BM Date: 12/27/20  Physical Exam Vitals reviewed.  Constitutional:      General: She is not in acute distress. HENT:     Head: Normocephalic and atraumatic.     Nose: Nose normal. No congestion.     Mouth/Throat:     Mouth: Mucous membranes are moist.     Pharynx: Oropharynx is clear.  Eyes:     Extraocular Movements: Extraocular movements intact.     Comments: Conjunctival pallor   Cardiovascular:     Rate and Rhythm:  Normal rate and regular rhythm.  Pulmonary:     Effort: Pulmonary effort is normal. No respiratory distress.  Abdominal:     General: Bowel sounds are normal. There is no distension.     Palpations: Abdomen is soft. There is no mass.     Tenderness: There is no abdominal tenderness. There is no guarding or rebound.     Hernia: No hernia is present.  Musculoskeletal:        General: No swelling or tenderness.     Cervical back: Normal range of motion and neck supple.  Skin:    General: Skin is warm and dry.  Neurological:     General: No focal deficit present.     Mental Status: She is alert and oriented to person, place, and time.  Psychiatric:        Mood and Affect: Mood normal.        Behavior: Behavior normal. Behavior is cooperative.     GI:  Lab Results: Recent Labs    12/27/20 2241 12/28/20 0452  WBC 6.7 5.7  HGB 6.6* 6.6*  HCT 25.4* 25.6*  PLT 317 284   BMET Recent Labs    12/28/20 0452  NA 138  K 3.8  CL 104  CO2 26  GLUCOSE 91  BUN 11  CREATININE 0.64  CALCIUM 8.4*   LFT Recent Labs    12/28/20 0452  PROT 5.7*  ALBUMIN 2.8*  AST 31  ALT 18  ALKPHOS 55  BILITOT 1.7*   PT/INR No results for input(s): LABPROT, INR in the last 72 hours.   Studies/Results: No results found.  Impression: Painless hematochezia, most consistent with diverticular bleeding.  Less likely related to large internal hemorrhoids, though these could also be cause of intermittent BRBPR. -Hgb 6.6, decreased from 11.3 in 09/2020 -Normal BUN (11) and Cr (0.64)  A fib, on Eliquis  Plan: Given absence of bleeding over the last 1-2 days, would recommend conservative management.  Clear liquid diet.  If re-bleeding occurs, consider nuclear bleeding scan, or if destabilizing bleeding, CT-A.  Continue to monitor H&H with transfusion as needed to maintain Hgb >7.  Continue to hold Eliquis.  [facility] will follow.   LOS: 1 day   Salley Slaughter  PA-C 12/28/2020,  8:54 AM  Contact #  [phone]

## 2020-12-29 ENCOUNTER — Other Ambulatory Visit: Payer: Self-pay

## 2020-12-29 DIAGNOSIS — K922 Gastrointestinal hemorrhage, unspecified: Secondary | ICD-10-CM | POA: Diagnosis not present

## 2020-12-29 LAB — TYPE AND SCREEN
ABO/RH(D): O POS
Antibody Screen: POSITIVE
Donor AG Type: NEGATIVE
Donor AG Type: NEGATIVE
Unit division: 0
Unit division: 0

## 2020-12-29 LAB — CBC
HCT: 32.2 % — ABNORMAL LOW (ref 36.0–46.0)
Hemoglobin: 9.1 g/dL — ABNORMAL LOW (ref 12.0–15.0)
MCH: 20 pg — ABNORMAL LOW (ref 26.0–34.0)
MCHC: 28.3 g/dL — ABNORMAL LOW (ref 30.0–36.0)
MCV: 70.8 fL — ABNORMAL LOW (ref 80.0–100.0)
Platelets: 279 10*3/uL (ref 150–400)
RBC: 4.55 MIL/uL (ref 3.87–5.11)
RDW: 25.2 % — ABNORMAL HIGH (ref 11.5–15.5)
WBC: 6.9 10*3/uL (ref 4.0–10.5)
nRBC: 0.3 % — ABNORMAL HIGH (ref 0.0–0.2)

## 2020-12-29 LAB — BPAM RBC
Blood Product Expiration Date: 202206262359
Blood Product Expiration Date: 202206282359
ISSUE DATE / TIME: 202205251104
ISSUE DATE / TIME: 202205251416
Unit Type and Rh: 5100
Unit Type and Rh: 5100

## 2020-12-29 LAB — BASIC METABOLIC PANEL
Anion gap: 6 (ref 5–15)
BUN: 7 mg/dL — ABNORMAL LOW (ref 8–23)
CO2: 28 mmol/L (ref 22–32)
Calcium: 8.3 mg/dL — ABNORMAL LOW (ref 8.9–10.3)
Chloride: 102 mmol/L (ref 98–111)
Creatinine, Ser: 0.6 mg/dL (ref 0.44–1.00)
GFR, Estimated: >60 mL/min (ref >60)
Glucose, Bld: 110 mg/dL — ABNORMAL HIGH (ref 70–99)
Potassium: 3.7 mmol/L (ref 3.5–5.1)
Sodium: 136 mmol/L (ref 135–145)

## 2020-12-29 NOTE — Progress Notes (Signed)
Martin visited pt. briefly while rounding; pt. expressed eagerness to be discharged tomorrow to her home in  [place], [place].  No further needs at this time.

## 2020-12-29 NOTE — Progress Notes (Signed)
No further bowel movements, and specifically no visible bleeding.    Appropriate posttransfusion rise in hemoglobin following transfusion of 2 units of packed cells yesterday.  Current hemoglobin 9.1, stable from yesterday evening.  Impression: Quiescent diverticular bleed, now off anticoagulation.  It appears it has been about 3 days since her last bleeding.  Recommendation:  1.  I have ordered a diet for the patient  2.  If no further bleeding and hemoglobin stable, okay for discharge tomorrow from GI tract standpoint  3.  I would favor the patient remaining off anticoagulation for several additional days to allow clot maturation and help ensure that the patient will not rebleed.  Cleotis Nipper, M.D. Pager [phone] If no answer or after 5 PM call [phone]

## 2020-12-29 NOTE — Evaluation (Signed)
Physical Therapy Evaluation Patient Details Name: [name] MRN: [id number] DOB: 1946-06-22 Today's Date: 12/29/2020   History of Present Illness  75 y.o. female who presented to ER with blood per rectum. Dx of GIB. Pt with medical history significant for atrial fibrillation, HTN, [name], hx of CVA  Clinical Impression  Pt admitted with above diagnosis. Pt ambulated 16' with RW, no loss of balance, distance limited by fatigue, HR 115 with walking. She walks with a RW at baseline and has 24 hour assistance available at home. She is ready to DC home from a PT standpoint.  Pt currently with functional limitations due to the deficits listed below (see PT Problem List). Pt will benefit from skilled PT to increase their independence and safety with mobility to allow discharge to the venue listed below.       Follow Up Recommendations No PT follow up    Equipment Recommendations  None recommended by PT    Recommendations for Other Services       Precautions / Restrictions Precautions Precautions: Fall Precaution Comments: h/o 1 fall in past 1 year, fell 6 months ago sustaining a R hip fx (got up to walk and didn't realize her leg was asleep) Restrictions Weight Bearing Restrictions: No      Mobility  Bed Mobility Overal bed mobility: Needs Assistance Bed Mobility: Supine to Sit     Supine to sit: Min assist;HOB elevated     General bed mobility comments: up in recliner    Transfers Overall transfer level: Needs assistance Equipment used: Rolling walker (2 wheeled) Transfers: Sit to/from Stand Sit to Stand: Min assist         General transfer comment: VCs for hand placement, sit to stand x 2, min A to power up on 1st trial then min/guard on 2nd trial  Ambulation/Gait   Gait Distance (Feet): 50 Feet Assistive device: Rolling walker (2 wheeled) Gait Pattern/deviations: Step-through pattern;Decreased stride length Gait velocity: decr   General Gait Details: steady  without loss of balance, HR 115 with walking, distance limited by fatigue  Stairs            Wheelchair Mobility    Modified Rankin (Stroke Patients Only)       Balance Overall balance assessment: Needs assistance Sitting-balance support: Feet supported Sitting balance-Leahy Scale: Good     Standing balance support: Bilateral upper extremity supported;Single extremity supported Standing balance-Leahy Scale: Poor Standing balance comment: reliant on at least unilateral support                             Pertinent Vitals/Pain Pain Assessment: No/denies pain    Home Living Family/patient expects to be discharged to:: Private residence Living Arrangements: Spouse/significant other Available Help at Discharge: Family;Available 24 hours/day Type of Home: House Home Access: Level entry     Home Layout: One level Home Equipment: Walker - 2 wheels;Shower seat;Grab bars - tub/shower;Adaptive equipment Additional Comments: has daughters and grandsons that lives nearby, can assist as well as spouse    Prior Function Level of Independence: Independent with assistive device(s)         Comments: ambulates with walker since hip fx 6 months ago     Hand Dominance   Dominant Hand: Left    Extremity/Trunk Assessment   Upper Extremity Assessment Upper Extremity Assessment: Defer to OT evaluation    Lower Extremity Assessment Lower Extremity Assessment: Overall WFL for tasks assessed    Cervical /  Trunk Assessment Cervical / Trunk Assessment: Normal  Communication   Communication: No difficulties  Cognition Arousal/Alertness: Awake/alert Behavior During Therapy: WFL for tasks assessed/performed Overall Cognitive Status: Within Functional Limits for tasks assessed                                        General Comments      Exercises     Assessment/Plan    PT Assessment Patient needs continued PT services  PT Problem List  Decreased activity tolerance       PT Treatment Interventions Gait training;Therapeutic exercise;Patient/family education    PT Goals (Current goals can be found in the Care Plan section)  Acute Rehab PT Goals Patient Stated Goal: go home soon PT Goal Formulation: With patient Time For Goal Achievement: 01/12/21 Potential to Achieve Goals: Good    Frequency Min 3X/week   Barriers to discharge        Co-evaluation               AM-PAC PT "6 Clicks" Mobility  Outcome Measure Help needed turning from your back to your side while in a flat bed without using bedrails?: A Little Help needed moving from lying on your back to sitting on the side of a flat bed without using bedrails?: A Little Help needed moving to and from a bed to a chair (including a wheelchair)?: A Little Help needed standing up from a chair using your arms (e.g., wheelchair or bedside chair)?: A Little Help needed to walk in hospital room?: None Help needed climbing 3-5 steps with a railing? : A Little 6 Click Score: 19    End of Session Equipment Utilized During Treatment: Gait belt Activity Tolerance: Patient tolerated treatment well;Patient limited by fatigue Patient left: in chair;with call bell/phone within reach;with chair alarm set Nurse Communication: Mobility status PT Visit Diagnosis: Difficulty in walking, not elsewhere classified (R26.2)    Time: 3094-0768 PT Time Calculation (min) (ACUTE ONLY): 14 min   Charges:   PT Evaluation $PT Eval Moderate Complexity: 1 Mod         Philomena Doheny PT 12/29/2020  Acute Rehabilitation Services Pager [phone] Office [phone]

## 2020-12-29 NOTE — Evaluation (Signed)
Occupational Therapy Evaluation Patient Details Name: [name] MRN: [id number] DOB: 24-Nov-1945 Today's Date: 12/29/2020    History of Present Illness 75 y.o. female who presented to ER with blood per rectum. Dx of GIB. Pt with medical history significant for atrial fibrillation, HTN, [name], hx of CVA   Clinical Impression   Patient lives with spouse in a single level home with level entry. Patient reports mod I with self care at baseline, uses a walker for ambulation. Currently patient limited by weakness and decreased activity tolerance "I haven't been out of bed in days." Patient needing max A to don socks, although has sock aid or goes barefoot at home, min A with cues for hand placement to power up to standing from bed and min G ambulating to sink then recliner. Patient is mildly unsteady, educate her on having someone with her at home initially to provide supervision with ambulation/transfers, verbalized understanding and reports in addition to her husband has other family that lives close by "they will help me too if I need it." Also encourage ambulation to bathroom with nursing to increase OOB activity tolerance necessary to safely return home, verbalize understanding. Will continue to follow acutely.     Follow Up Recommendations  Supervision/Assistance - 24 hour;No OT follow up    Equipment Recommendations  None recommended by OT       Precautions / Restrictions Precautions Precautions: Fall Restrictions Weight Bearing Restrictions: No      Mobility Bed Mobility Overal bed mobility: Needs Assistance Bed Mobility: Supine to Sit     Supine to sit: Min assist;HOB elevated     General bed mobility comments: min A to elevate trunk to sitting    Transfers Overall transfer level: Needs assistance Equipment used: Rolling walker (2 wheeled) Transfers: Sit to/from Stand Sit to Stand: Min assist         General transfer comment: cues to push from bed and min A to  power up to standing    Balance Overall balance assessment: Needs assistance Sitting-balance support: Feet supported Sitting balance-Leahy Scale: Good     Standing balance support: Bilateral upper extremity supported;Single extremity supported Standing balance-Leahy Scale: Poor Standing balance comment: reliant on at least unilateral support                           ADL either performed or assessed with clinical judgement   ADL Overall ADL's : Needs assistance/impaired     Grooming: Oral care;Wash/dry face;Wash/dry hands;Supervision/safety;Standing Grooming Details (indicate cue type and reason): leans onto sink for stability, reports feeling weak Upper Body Bathing: Set up;Sitting   Lower Body Bathing: Sitting/lateral leans;Sit to/from stand;Moderate assistance   Upper Body Dressing : Set up;Sitting   Lower Body Dressing: Maximal assistance;Sitting/lateral leans;Sit to/from stand Lower Body Dressing Details (indicate cue type and reason): to don socks, however patient reports she uses a sock aid at home or goes barefoot. was able to wash peri area in standing at min G assist Toilet Transfer: Minimal assistance;Cueing for safety;Ambulation;RW Toilet Transfer Details (indicate cue type and reason): min A to power up to standing from edge of bed with cues for hand placement, min G with ambulation. limited activity tolerance Toileting- Clothing Manipulation and Hygiene: Minimal assistance;Sitting/lateral lean;Sit to/from stand       Functional mobility during ADLs: Minimal assistance;Rolling walker;Cueing for safety General ADL Comments: patient needing increased assistance with self care tasks due to decreased activity tolerance and weakness  Pertinent Vitals/Pain Pain Assessment: No/denies pain     Hand Dominance Left   Extremity/Trunk Assessment Upper Extremity Assessment Upper Extremity Assessment: Generalized weakness   Lower  Extremity Assessment Lower Extremity Assessment: Defer to PT evaluation       Communication Communication Communication: No difficulties   Cognition Arousal/Alertness: Awake/alert Behavior During Therapy: WFL for tasks assessed/performed Overall Cognitive Status: Within Functional Limits for tasks assessed                                                Home Living Family/patient expects to be discharged to:: Private residence Living Arrangements: Spouse/significant other Available Help at Discharge: Family;Available 24 hours/day Type of Home: House Home Access: Level entry     Home Layout: One level     Bathroom Shower/Tub: Occupational psychologist: Handicapped height     Home Equipment: Environmental consultant - 2 wheels;Shower seat;Grab bars - tub/shower;Adaptive equipment Adaptive Equipment: Reacher;Sock aid Additional Comments: has daughters and grandsons that lives nearby, can assist as well as spouse      Prior Functioning/Environment Level of Independence: Independent with assistive device(s)        Comments: ambulates with walker        OT Problem List: Decreased activity tolerance;Impaired balance (sitting and/or standing);Decreased safety awareness;Decreased strength      OT Treatment/Interventions: Self-care/ADL training;Therapeutic activities;Patient/family education;Balance training    OT Goals(Current goals can be found in the care plan section) Acute Rehab OT Goals Patient Stated Goal: home tomorrow OT Goal Formulation: With patient Time For Goal Achievement: 01/12/21 Potential to Achieve Goals: Good  OT Frequency: Min 2X/week    AM-PAC OT "6 Clicks" Daily Activity     Outcome Measure Help from another person eating meals?: None Help from another person taking care of personal grooming?: A Little Help from another person toileting, which includes using toliet, bedpan, or urinal?: A Little Help from another person bathing  (including washing, rinsing, drying)?: A Lot Help from another person to put on and taking off regular upper body clothing?: A Little Help from another person to put on and taking off regular lower body clothing?: A Lot 6 Click Score: 17   End of Session Equipment Utilized During Treatment: Rolling walker Nurse Communication: Mobility status  Activity Tolerance: Patient tolerated treatment well Patient left: in chair;with call bell/phone within reach  OT Visit Diagnosis: Unsteadiness on feet (R26.81)                Time: 6644-0347 OT Time Calculation (min): 23 min Charges:  OT General Charges $OT Visit: 1 Visit OT Evaluation $OT Eval Low Complexity: 1 Low OT Treatments $Self Care/Home Management : 8-22 mins  Delbert Phenix OT OT pager: [place] 12/29/2020, 11:24 AM

## 2020-12-29 NOTE — Progress Notes (Signed)
PROGRESS NOTE    [name]  VOZ:[id number] DOB: [birth year] DOA: 12/27/2020 PCP: Caryl Bis, MD   Brief Narrative:  HPI: [name] is a 75 y.o. female with medical history significant for atrial fibrillation, HTN, [name], hx of CVA who presented to ER with blood per rectum. Is on Eliquis for a-fib. A few days ago was seen by her PCP and had a low Hgb of 6.8 and was instructed to stop eliquis and follow up with GI. She did not go to GI. She developed fatigue and SOB with excertion and her husband took her to the ER. Hgb found to be 6.1. Pt is confused and does not remember if she stopped her the eliquis. She was given Kcentra in the ER. Given one unit PRBC in ER. She was admitted a few years ago for GI bleed and at that time had diverticular bleed vs hemoroid bleed per records sent from [facility]. Has not seen GI since then.  Pt is confused and doesn't have clear recollection of events of today. No report of fever, chills, nausea, vomting, diarrhea. Had a CTA of abdomen and pelvis at ER which did not show any active bleed.  No bleeding at this time.   Assessment & Plan:   Principal Problem:   Lower GI bleed Active Problems:   Essential hypertension, benign   Persistent atrial fibrillation (HCC)   Anemia due to blood loss   Acute blood loss anemia secondary to bright red blood per rectum/lower GI bleeding: Reportedly, patient's hemoglobin was 6.1 at outside ER for which she was transfused 1 unit of PRBC.  Her hemoglobin here was 6.5.  2 units of PRBC for transfusion given on 12/28/2020.  Good and expected rise in hemoglobin currently 9.1.  Has not had any further rectal bleeding or any BM.  Seen by GI, advancing diet to soft.  Will monitor another day per GI recommendation and possible discharge tomorrow if hemoglobin is stable and no signs of bleeding.  GI recommends holding Eliquis for several days.  Essential hypertension: Blood pressure controlled.  Continue home  medications which includes diltiazem and metoprolol.  Permanent atrial fibrillation: Rate is controlled, continue metoprolol and diltiazem.  Continue to hold Eliquis.  Mixed hyperlipidemia: Continue pravastatin.  DVT prophylaxis: SCDs Start: 12/27/20 2219   Code Status: Full Code  Family Communication: None present at bedside.  Plan of care discussed with patient in length and he verbalized understanding and agreed with it.  Status is: Inpatient  Remains inpatient appropriate because:Ongoing diagnostic testing needed not appropriate for outpatient work up   Dispo: The patient is from: Home              Anticipated d/c is to: Home              Patient currently is not medically stable to d/c.   Difficult to place patient No        Estimated body mass index is 32.51 kg/m as calculated from the following:   Height as of this encounter: 5\' 4"  (1.626 m).   Weight as of this encounter: 85.9 kg.      Nutritional status:               Consultants:   GI  Procedures:   None  Antimicrobials:  Anti-infectives (From admission, onward)   None         Subjective: Seen and examined.  She has no complaints.  She feels better today.  No  further bleeding.  Objective: Vitals:   12/29/20 0527 12/29/20 0809 12/29/20 0905 12/29/20 1252  BP: 126/61  116/62 114/66  Pulse: 75  67 90  Resp: 20  20 (!) 24  Temp: 97.8 F (36.6 C)   98.1 F (36.7 C)  TempSrc: Oral     SpO2: 98% 97% 96% 95%  Weight:      Height:        Intake/Output Summary (Last 24 hours) at 12/29/2020 1307 Last data filed at 12/29/2020 0900 Gross per 24 hour  Intake 1467.5 ml  Output 2550 ml  Net -1082.5 ml   Filed Weights   12/27/20 2120  Weight: 85.9 kg    Examination:  General exam: Appears calm and comfortable  Respiratory system: Clear to auscultation. Respiratory effort normal. Cardiovascular system: S1 & S2 heard, RRR. No JVD, murmurs, rubs, gallops or clicks. No pedal  edema. Gastrointestinal system: Abdomen is nondistended, soft and nontender. No organomegaly or masses felt. Normal bowel sounds heard. Central nervous system: Alert and oriented. No focal neurological deficits. Extremities: Symmetric 5 x 5 power. Skin: No rashes, lesions or ulcers.  Psychiatry: Judgement and insight appear normal. Mood & affect appropriate.    Data Reviewed: I have personally reviewed following labs and imaging studies  CBC: Recent Labs  Lab 12/27/20 2241 12/28/20 0452 12/28/20 1928 12/29/20 0807  WBC 6.7 5.7  --  6.9  HGB 6.6* 6.6* 8.8* 9.1*  HCT 25.4* 25.6* 31.6* 32.2*  MCV 66.8* 67.0*  --  70.8*  PLT 317 284  --  778   Basic Metabolic Panel: Recent Labs  Lab 12/28/20 0452 12/29/20 0807  NA 138 136  K 3.8 3.7  CL 104 102  CO2 26 28  GLUCOSE 91 110*  BUN 11 7*  CREATININE 0.64 0.60  CALCIUM 8.4* 8.3*   GFR: Estimated Creatinine Clearance: 64.5 mL/min (by C-G formula based on SCr of 0.6 mg/dL). Liver Function Tests: Recent Labs  Lab 12/28/20 0452  AST 31  ALT 18  ALKPHOS 55  BILITOT 1.7*  PROT 5.7*  ALBUMIN 2.8*   No results for input(s): LIPASE, AMYLASE in the last 168 hours. No results for input(s): AMMONIA in the last 168 hours. Coagulation Profile: No results for input(s): INR, PROTIME in the last 168 hours. Cardiac Enzymes: No results for input(s): CKTOTAL, CKMB, CKMBINDEX, TROPONINI in the last 168 hours. BNP (last 3 results) No results for input(s): PROBNP in the last 8760 hours. HbA1C: No results for input(s): HGBA1C in the last 72 hours. CBG: No results for input(s): GLUCAP in the last 168 hours. Lipid Profile: No results for input(s): CHOL, HDL, LDLCALC, TRIG, CHOLHDL, LDLDIRECT in the last 72 hours. Thyroid Function Tests: No results for input(s): TSH, T4TOTAL, FREET4, T3FREE, THYROIDAB in the last 72 hours. Anemia Panel: No results for input(s): VITAMINB12, FOLATE, FERRITIN, TIBC, IRON, RETICCTPCT in the last 72  hours. Sepsis Labs: No results for input(s): PROCALCITON, LATICACIDVEN in the last 168 hours.  No results found for this or any previous visit (from the past 240 hour(s)).    Radiology Studies: No results found.  Scheduled Meds: . diltiazem  120 mg Oral Daily  . furosemide  40 mg Oral BID  . metoprolol tartrate  37.5 mg Oral BID  . pantoprazole  40 mg Oral Daily  . potassium chloride SA  20 mEq Oral Daily  . pravastatin  20 mg Oral QPM   Continuous Infusions:    LOS: 2 days   Time spent: 28 minutes  Darliss Cheney, MD [facility]  12/29/2020, 1:07 PM   How to contact the [facility] Attending or Consulting provider [place] or covering provider during after hours [place], for this patient?  1. Check the care team in [facility] and look for a) attending/consulting [facility] provider listed and b) the [facility] team listed. Page or secure chat [phone]. 2. Log into [email or web address] and use [facility]'s universal password to access. If you do not have the password, please contact the hospital operator. 3. Locate the [facility] provider you are looking for under [facility] and page to a number that you can be directly reached. 4. If you still have difficulty reaching the provider, please page the [facility] (Director on Call) for the Hospitalists listed on amion for assistance.

## 2020-12-29 NOTE — Plan of Care (Signed)
  Problem: Clinical Measurements: Goal: Diagnostic test results will improve Outcome: Progressing Goal: Respiratory complications will improve Outcome: Progressing Goal: Cardiovascular complication will be avoided Outcome: Progressing   

## 2020-12-30 LAB — CBC
HCT: 33.2 % — ABNORMAL LOW (ref 36.0–46.0)
HCT: 32 % — ABNORMAL LOW (ref 36.0–46.0)
HCT: 32.5 % — ABNORMAL LOW (ref 36.0–46.0)
Hemoglobin: 9 g/dL — ABNORMAL LOW (ref 12.0–15.0)
Hemoglobin: 8.9 g/dL — ABNORMAL LOW (ref 12.0–15.0)
Hemoglobin: 8.9 g/dL — ABNORMAL LOW (ref 12.0–15.0)
MCH: 19.4 pg — ABNORMAL LOW (ref 26.0–34.0)
MCH: 19.9 pg — ABNORMAL LOW (ref 26.0–34.0)
MCH: 19.7 pg — ABNORMAL LOW (ref 26.0–34.0)
MCHC: 27.1 g/dL — ABNORMAL LOW (ref 30.0–36.0)
MCHC: 27.8 g/dL — ABNORMAL LOW (ref 30.0–36.0)
MCHC: 27.4 g/dL — ABNORMAL LOW (ref 30.0–36.0)
MCV: 71.7 fL — ABNORMAL LOW (ref 80.0–100.0)
MCV: 71.6 fL — ABNORMAL LOW (ref 80.0–100.0)
MCV: 71.9 fL — ABNORMAL LOW (ref 80.0–100.0)
Platelets: 268 10*3/uL (ref 150–400)
Platelets: 253 10*3/uL (ref 150–400)
Platelets: 285 10*3/uL (ref 150–400)
RBC: 4.63 MIL/uL (ref 3.87–5.11)
RBC: 4.47 MIL/uL (ref 3.87–5.11)
RBC: 4.52 MIL/uL (ref 3.87–5.11)
RDW: 25.8 % — ABNORMAL HIGH (ref 11.5–15.5)
RDW: 26.1 % — ABNORMAL HIGH (ref 11.5–15.5)
RDW: 26.3 % — ABNORMAL HIGH (ref 11.5–15.5)
WBC: 6.1 10*3/uL (ref 4.0–10.5)
WBC: 6.7 10*3/uL (ref 4.0–10.5)
WBC: 7.7 10*3/uL (ref 4.0–10.5)
nRBC: 0.3 % — ABNORMAL HIGH (ref 0.0–0.2)
nRBC: 0 % (ref 0.0–0.2)
nRBC: 0 % (ref 0.0–0.2)

## 2020-12-30 MED ORDER — ELIQUIS 5 MG PO TABS: 5.0000 mg | ORAL_TABLET | Freq: Two times a day (BID) | ORAL | Status: DC

## 2020-12-30 NOTE — Progress Notes (Signed)
Received report from the patient's primary nurse Anderson Malta that patient was getting ready to get discharged and then suddenly she had a spontaneous rectal bleeding.  The nurse believes that patient had hemorrhoidal bleed as she saw the blood on the napkin when she cleaned her as well.  Unfortunately, blood was flushed by the husband already before I could go and see myself.  Discussed case with Dr. Cristina Gong.  He believes that patient's bleeding is likely diverticular and recommends resetting the clock and keeping the patient in the hospital for close monitoring for another 2 to 3 days and continue to hold Eliquis and repeating hemoglobin later today and tomorrow morning.  Can wait these recommendations to patient's primary RN to convey to the patient.  If for any reason, patient has further episodes of rectal bleeding, Dr. Cristina Gong recommends bleeding scan.  We are canceling the discharge at this point in time

## 2020-12-30 NOTE — Care Management Important Message (Signed)
Important Message  Patient Details IM Letter given to the Patient. Name: [name] MRN: [id number] Date of Birth: 03/22/1946   Medicare Important Message Given:  Yes     Kerin Salen 12/30/2020, 3:10 PM

## 2020-12-30 NOTE — Progress Notes (Signed)
Pt was preparing to be discharged this afternoon. Pt assisted up to bathroom to void. NT came and reported to RN that blood was noted in the toilet. RN noted moderate amount of bright red blood in the toilet and RN then assessed rectal region Hemorrhoids noted and rectum wiped and blood was noted on washcloth and small amount on Pt's bed pad.   MD was made aware via phone. VS checked and are stable. Discharge to be canceled Pt and Pt's husband updated. Maintain current plan of care and monitor Pt closely.

## 2020-12-30 NOTE — Plan of Care (Signed)
  Problem: Coping: Goal: Level of anxiety will decrease Outcome: Completed/Met

## 2020-12-30 NOTE — Progress Notes (Signed)
I agree with the previous RN's assessment. 

## 2020-12-30 NOTE — Discharge Summary (Signed)
Physician Discharge Summary  [name] VHQ:[id number] DOB: [birth year] DOA: 12/27/2020  PCP: Caryl Bis, MD  Admit date: 12/27/2020 Discharge date: 12/30/2020 30 Day Unplanned Readmission Risk Score   Flowsheet Row Admission (Current) from 12/27/2020 in [place]  30 Day Unplanned Readmission Risk Score (%) 9.2 Filed at 12/30/2020 0801     This score is the patient's risk of an unplanned readmission within 30 days of being discharged (0 -100%). The score is based on dignosis, age, lab data, medications, orders, and past utilization.   Low:  0-14.9   Medium: 15-21.9   High: 22-29.9   Extreme: 30 and above         Admitted From: Home Disposition: Home  Recommendations for Outpatient Follow-up:  1. Follow up with PCP in 1-2 weeks 2. Please hold Eliquis for next 3 days and start taking them on Monday if no evidence of active bleeding. 3. Please obtain BMP/CBC in one week 4. Please follow up with your PCP on the following pending results: Unresulted Labs (From admission, onward)         None        Home Health: None Equipment/Devices: None  Discharge Condition: Stable CODE STATUS: Full code Diet recommendation: Cardiac  Subjective: Seen and examined.  No complaints.  No evidence of rectal bleeding since hospitalization.  She is ready to go home.  Brief/Interim Summary: Chenoah Mcnally Watkinsis a 75 y.o.femalewith medical history significant for atrial fibrillation on Eliquis, HTN, [name], [name]  Reportedly she was seen by her PCP a few days ago and had a low Hgb of 6.8 and was instructed to stop eliquis and follow up with GI. She did not go to GI. She developed fatigue and SOB with excertion and her husband took her to the ER. Hgb in ER was found to be 6.1.  She does not remember if she stopped her the eliquis. She was given Kcentra in the ER. Given one unit PRBC in ER and  transferred to [facility] for GI evaluation.  She had CTA of abdomen and pelvis at ER which did not show any active bleeding.  Upon arrival to [place], her hemoglobin was 6.5.  She received 2 more units of PRBCs transfusion on 12/28/2020.  Her hemoglobin improved to 9.1 and remained stable for past 3 days.  Patient has not had any rectal bleeding since admission which has been 3 days now.  She was seen by GI.  Her bleeding was thought to be secondary to diverticular bleeding which was expected to stop by itself and thus GI did not recommend colonoscopy but recommended watchful waiting.  GI has signed off now that she has no further episodes of bleeding.  However they have recommended to continue to hold her Eliquis for few more days and resume if no signs of bleeding.  She has been clearly advised to hold her Eliquis until Monday and start taking that if no evidence of bleeding.  She was evaluated by PT OT who did not recommend any home health PT.  She is being discharged in stable condition today.  Discharge Diagnoses:  Principal Problem:   Lower GI bleed Active Problems:   Essential hypertension, benign   Persistent atrial fibrillation (HCC)   Anemia due to blood loss    Discharge Instructions   Allergies as of 12/30/2020      Reactions   Codeine Other (  See Comments)   REACTION: Hallucinations      Medication List    TAKE these medications   alendronate 70 MG tablet Commonly known as: FOSAMAX Take 70 mg by mouth once a week.   Calcium + Vitamin D3 600-400 MG-UNIT Tabs Generic drug: Calcium Carbonate-Vitamin D3 Take 1 tablet by mouth 2 (two) times daily.   CoQ-10 100 MG Caps Take 100 mg by mouth daily.   diltiazem 120 MG 24 hr capsule Commonly known as: CARDIZEM CD Take 120 mg by mouth daily.   Eliquis 5 MG Tabs tablet Generic drug: apixaban Take 1 tablet (5 mg total) by mouth 2 (two) times daily. Start taking on: Jan 02, 2021 What changed:   how much to take  These  instructions start on Jan 02, 2021. If you are unsure what to do until then, ask your doctor or other care provider.   furosemide 40 MG tablet Commonly known as: LASIX Take 40 mg by mouth 2 (two) times daily.   metoprolol tartrate 25 MG tablet Commonly known as: LOPRESSOR TAKE 1&1/2 TABLETS BY MOUTH TWICE DAILY What changed: See the new instructions.   pantoprazole 40 MG tablet Commonly known as: PROTONIX Take 40 mg by mouth daily.   potassium chloride SA 20 MEQ tablet Commonly known as: KLOR-CON TAKE (1) TABLET BY MOUTH ONCE DAILY. What changed: See the new instructions.   pravastatin 20 MG tablet Commonly known as: PRAVACHOL Take 20 mg by mouth every evening.   sertraline 50 MG tablet Commonly known as: ZOLOFT Take 50 mg by mouth daily.   Vitamin D3 25 MCG (1000 UT) Caps Take 1,000 Units by mouth daily.       Follow-up Information    Caryl Bis, MD Follow up in 1 week(s).   Specialty: Family Medicine Contact information: [place] [address] [phone]        Herminio Commons, MD .   Specialty: Cardiology Contact information: [place] [address] [phone]              Allergies  Allergen Reactions  . Codeine Other (See Comments)    REACTION: Hallucinations    Consultations: GI   Procedures/Studies:  No results found.   Discharge Exam: Vitals:   12/29/20 2024 12/30/20 0423  BP: (!) 122/58 (!) 113/54  Pulse: 74 80  Resp: 20 16  Temp: 98.5 F (36.9 C) 98.3 F (36.8 C)  SpO2: 93% 95%   Vitals:   12/29/20 0905 12/29/20 1252 12/29/20 2024 12/30/20 0423  BP: 116/62 114/66 (!) 122/58 (!) 113/54  Pulse: 67 90 74 80  Resp: 20 (!) 24 20 16   Temp:  98.1 F (36.7 C) 98.5 F (36.9 C) 98.3 F (36.8 C)  TempSrc:   Oral Oral  SpO2: 96% 95% 93% 95%  Weight:      Height:        General: Pt is alert, awake, not in acute distress Cardiovascular: RRR, S1/S2 +, no rubs, no gallops Respiratory: CTA  bilaterally, no wheezing, no rhonchi Abdominal: Soft, NT, ND, bowel sounds + Extremities: no edema, no cyanosis    The results of significant diagnostics from this hospitalization (including imaging, microbiology, ancillary and laboratory) are listed below for reference.     Microbiology: No results found for this or any previous visit (from the past 240 hour(s)).   Labs: BNP (last 3 results) No results for input(s): BNP in the last 8760 hours. Basic Metabolic Panel: Recent Labs  Lab 12/28/20 0452 12/29/20 0807  NA 138 136  K 3.8 3.7  CL 104 102  CO2 26 28  GLUCOSE 91 110*  BUN 11 7*  CREATININE 0.64 0.60  CALCIUM 8.4* 8.3*   Liver Function Tests: Recent Labs  Lab 12/28/20 0452  AST 31  ALT 18  ALKPHOS 55  BILITOT 1.7*  PROT 5.7*  ALBUMIN 2.8*   No results for input(s): LIPASE, AMYLASE in the last 168 hours. No results for input(s): AMMONIA in the last 168 hours. CBC: Recent Labs  Lab 12/27/20 2241 12/28/20 0452 12/28/20 1928 12/29/20 0807 12/30/20 0522  WBC 6.7 5.7  --  6.9 6.1  HGB 6.6* 6.6* 8.8* 9.1* 9.0*  HCT 25.4* 25.6* 31.6* 32.2* 33.2*  MCV 66.8* 67.0*  --  70.8* 71.7*  PLT 317 284  --  279 268   Cardiac Enzymes: No results for input(s): CKTOTAL, CKMB, CKMBINDEX, TROPONINI in the last 168 hours. BNP: Invalid input(s): POCBNP CBG: No results for input(s): GLUCAP in the last 168 hours. D-Dimer No results for input(s): DDIMER in the last 72 hours. Hgb A1c No results for input(s): HGBA1C in the last 72 hours. Lipid Profile No results for input(s): CHOL, HDL, LDLCALC, TRIG, CHOLHDL, LDLDIRECT in the last 72 hours. Thyroid function studies No results for input(s): TSH, T4TOTAL, T3FREE, THYROIDAB in the last 72 hours.  Invalid input(s): FREET3 Anemia work up No results for input(s): VITAMINB12, FOLATE, FERRITIN, TIBC, IRON, RETICCTPCT in the last 72 hours. Urinalysis No results found for: COLORURINE, APPEARANCEUR, LABSPEC, [place], GLUCOSEU,  HGBUR, BILIRUBINUR, KETONESUR, PROTEINUR, UROBILINOGEN, NITRITE, LEUKOCYTESUR Sepsis Labs Invalid input(s): PROCALCITONIN,  WBC,  LACTICIDVEN Microbiology No results found for this or any previous visit (from the past 240 hour(s)).   Time coordinating discharge: Over 30 minutes  SIGNED:   Darliss Cheney, MD  [facility] 12/30/2020, 9:30 AM  If 7PM-7AM, please contact night-coverage [email or web address]

## 2020-12-30 NOTE — Progress Notes (Signed)
Patient reports a nonbloody bowel movement.  Hemoglobin stable over the past 24 hours, 9.0.  Impression: Probable diverticular bleed, now quiescent, with stable posthemorrhagic anemia.  Recommendations:  1.  Okay for discharge from GI tract standpoint at any time  2.  As mentioned in yesterday's note, I would favor delaying resumption of anticoagulation for approximately 3 more days to give time for clot maturation and thereby decrease the risk of recurrent bleeding.  3.  No dietary restriction from GI tract standpoint needed following discharge  4.  Concerning follow-up, I do not think follow-up in our office is required, but rather, the patient should follow-up with her primary physician approximately a week or so post discharge to monitor her hemoglobin and Hemoccult status.  We will be on standby in the event that either the patient or her primary physician would like GI follow-up.  I have given the patient my card for that purpose.  I will sign off.  Please call me if you have any questions.  Cleotis Nipper, M.D. Pager [phone] If no answer or after 5 PM call [phone]

## 2020-12-31 DIAGNOSIS — K922 Gastrointestinal hemorrhage, unspecified: Secondary | ICD-10-CM | POA: Diagnosis not present

## 2020-12-31 LAB — CBC
HCT: 32.6 % — ABNORMAL LOW (ref 36.0–46.0)
Hemoglobin: 8.9 g/dL — ABNORMAL LOW (ref 12.0–15.0)
MCH: 19.5 pg — ABNORMAL LOW (ref 26.0–34.0)
MCHC: 27.3 g/dL — ABNORMAL LOW (ref 30.0–36.0)
MCV: 71.3 fL — ABNORMAL LOW (ref 80.0–100.0)
Platelets: 272 10*3/uL (ref 150–400)
RBC: 4.57 MIL/uL (ref 3.87–5.11)
RDW: 26.8 % — ABNORMAL HIGH (ref 11.5–15.5)
WBC: 6.4 10*3/uL (ref 4.0–10.5)
nRBC: 0 % (ref 0.0–0.2)

## 2020-12-31 NOTE — Progress Notes (Addendum)
PROGRESS NOTE    [name]  INO:[id number] DOB: [birth year] DOA: 12/27/2020 PCP: Caryl Bis, MD   Brief Narrative:  Claudia Ferrer Watkinsis a 75 y.o.femalewith medical history significant for atrial fibrillation on Eliquis, HTN, [name], [name]  Reportedly she was seen by her PCP a few days ago and had a low Hgb of 6.8 and was instructed to stop eliquis and follow up with GI. She did not go to GI. She developed fatigue and SOB with excertion and her husband took her to the ER. Hgb in ER was found to be 6.1.  She does not remember if she stopped her the eliquis. She was given Kcentra in the ER. Given one unit PRBC in ER and transferred to [facility] for GI evaluation.  She had CTA of abdomen and pelvis at ER which did not show any active bleeding.  Upon arrival to [place], her hemoglobin was 6.5.  She received 2 more units of PRBCs transfusion on 12/28/2020.  Her hemoglobin improved to 9.1 and remained stable for past 3 days.  Patient has not had any rectal bleeding since admission which has been 3 days now.  She was seen by GI.  Her bleeding was thought to be secondary to diverticular bleeding which was expected to stop by itself and thus GI did not recommend colonoscopy but recommended watchful waiting.  GI had signed off now that she has no further episodes of bleeding.  However they have recommended to continue to hold her Eliquis for few more days and resume if no signs of bleeding.  Patient was in fact being discharged home on 12/30/2020 and right before discharge, she had another episode of rectal bleeding.  Case was discussed with GI and they recommended resetting the clock and keeping her in the hospital for monitoring for another 2 to 3 days.  Assessment & Plan:   Principal Problem:   Lower GI bleed Active Problems:   Essential hypertension, benign   Persistent atrial fibrillation (HCC)   Anemia due to blood loss   Acute  blood loss anemia secondary to bright red blood per rectum/lower GI bleeding: Reportedly, patient's hemoglobin was 6.1 at outside ER for which she was transfused 1 unit of PRBC.  Her hemoglobin here was 6.5.  2 units of PRBC for transfusion given on 12/28/2020.  Good and expected rise in hemoglobin around 9.  Patient was being discharged yesterday however before discharge, she had an episode of rectal bleeding and case was discussed with GI at that time and they recommended keeping the patient in the hospital for another 2 to 3 days for observation.  Continue to hold Eliquis.  Patient has not had any further bleeding since the episode yesterday.  Essential hypertension: Blood pressure controlled.  Continue home medications which includes diltiazem and metoprolol.  Permanent atrial fibrillation: Rate is controlled, continue metoprolol and diltiazem.  Continue to hold Eliquis.  Mixed hyperlipidemia: Continue pravastatin.  DVT prophylaxis: SCDs Start: 12/27/20 2219   Code Status: Full Code  Family Communication: None present at bedside.  Plan of care discussed with patient in length and he verbalized understanding and agreed with it.  Status is: Inpatient  Remains inpatient appropriate because:Ongoing diagnostic testing needed not appropriate for outpatient work up   Dispo: The patient is from: Home              Anticipated d/c is to: Home  Patient currently is not medically stable to d/c.   Difficult to place patient No        Estimated body mass index is 32.51 kg/m as calculated from the following:   Height as of this encounter: 5\' 4"  (1.626 m).   Weight as of this encounter: 85.9 kg.      Nutritional status:               Consultants:   GI  Procedures:   None  Antimicrobials:  Anti-infectives (From admission, onward)   None         Subjective: Seen and examined.  No complaints.  No further bleeding episodes since the   yesterday.  Objective: Vitals:   12/30/20 2100 12/31/20 0459 12/31/20 0836 12/31/20 0847  BP: 139/75 (!) 103/56 (!) 111/57 (!) 111/57  Pulse: 90 80 91 91  Resp: 18 20  12   Temp: 98.3 F (36.8 C) 98.3 F (36.8 C)    TempSrc: Oral Oral    SpO2: (!) 89% 91%  90%  Weight:      Height:        Intake/Output Summary (Last 24 hours) at 12/31/2020 1107 Last data filed at 12/31/2020 9833 Gross per 24 hour  Intake 600 ml  Output 500 ml  Net 100 ml   Filed Weights   12/27/20 2120  Weight: 85.9 kg    Examination:  General exam: Appears calm and comfortable  Respiratory system: Clear to auscultation. Respiratory effort normal. Cardiovascular system: S1 & S2 heard, RRR. No JVD, murmurs, rubs, gallops or clicks. No pedal edema. Gastrointestinal system: Abdomen is nondistended, soft and nontender. No organomegaly or masses felt. Normal bowel sounds heard. Central nervous system: Alert and oriented. No focal neurological deficits. Extremities: Symmetric 5 x 5 power. Skin: No rashes, lesions or ulcers.  Psychiatry: Judgement and insight appear normal. Mood & affect appropriate.   Data Reviewed: I have personally reviewed following labs and imaging studies  CBC: Recent Labs  Lab 12/29/20 0807 12/30/20 0522 12/30/20 1213 12/30/20 1652 12/31/20 0852  WBC 6.9 6.1 6.7 7.7 6.4  HGB 9.1* 9.0* 8.9* 8.9* 8.9*  HCT 32.2* 33.2* 32.0* 32.5* 32.6*  MCV 70.8* 71.7* 71.6* 71.9* 71.3*  PLT 279 268 253 285 825   Basic Metabolic Panel: Recent Labs  Lab 12/28/20 0452 12/29/20 0807  NA 138 136  K 3.8 3.7  CL 104 102  CO2 26 28  GLUCOSE 91 110*  BUN 11 7*  CREATININE 0.64 0.60  CALCIUM 8.4* 8.3*   GFR: Estimated Creatinine Clearance: 64.5 mL/min (by C-G formula based on SCr of 0.6 mg/dL). Liver Function Tests: Recent Labs  Lab 12/28/20 0452  AST 31  ALT 18  ALKPHOS 55  BILITOT 1.7*  PROT 5.7*  ALBUMIN 2.8*   No results for input(s): LIPASE, AMYLASE in the last 168 hours. No  results for input(s): AMMONIA in the last 168 hours. Coagulation Profile: No results for input(s): INR, PROTIME in the last 168 hours. Cardiac Enzymes: No results for input(s): CKTOTAL, CKMB, CKMBINDEX, TROPONINI in the last 168 hours. BNP (last 3 results) No results for input(s): PROBNP in the last 8760 hours. HbA1C: No results for input(s): HGBA1C in the last 72 hours. CBG: No results for input(s): GLUCAP in the last 168 hours. Lipid Profile: No results for input(s): CHOL, HDL, LDLCALC, TRIG, CHOLHDL, LDLDIRECT in the last 72 hours. Thyroid Function Tests: No results for input(s): TSH, T4TOTAL, FREET4, T3FREE, THYROIDAB in the last 72 hours. Anemia Panel: No  results for input(s): VITAMINB12, FOLATE, FERRITIN, TIBC, IRON, RETICCTPCT in the last 72 hours. Sepsis Labs: No results for input(s): PROCALCITON, LATICACIDVEN in the last 168 hours.  No results found for this or any previous visit (from the past 240 hour(s)).    Radiology Studies: No results found.  Scheduled Meds: . diltiazem  120 mg Oral Daily  . furosemide  40 mg Oral BID  . metoprolol tartrate  37.5 mg Oral BID  . pantoprazole  40 mg Oral Daily  . potassium chloride SA  20 mEq Oral Daily  . pravastatin  20 mg Oral QPM   Continuous Infusions:    LOS: 4 days   Time spent: 27 minutes   Darliss Cheney, MD [facility]  12/31/2020, 11:07 AM   How to contact the [facility] Attending or Consulting provider [place] or covering provider during after hours [place], for this patient?  1. Check the care team in [facility] and look for a) attending/consulting [facility] provider listed and b) the [facility] team listed. Page or secure chat [phone]. 2. Log into [email or web address] and use [place]'s universal password to access. If you do not have the password, please contact the hospital operator. 3. Locate the [facility] provider you are looking for under [facility] and page to a number that you can be directly reached. 4. If you still have  difficulty reaching the provider, please page the [facility] (Director on Call) for the Hospitalists listed on amion for assistance.

## 2020-12-31 NOTE — Progress Notes (Signed)
Patient had a bloody bowel movement yesterday just as she was preparing to go home.  However, hemoglobin holding steady at 8.9 for the past 48 hrs.  Rectal exam at this time shows some possible hemorrhoids, and soft brown stool with no visible blood present whatsoever.  Impression: I tend to think that yesterday's bleeding was not recurrent diverticular hemorrhage, but rather, rectal outlet bleeding from hemorrhoids.  (If she had had recurrent diverticular bleeding, it would seem reasonable that there would be at least a small amount of residual blood in the stool at this time.)  Recommendation:   1.  Okay for discharge tomorrow if no further bleeding and if hemoglobin remains reasonably stable.  Discussed with patient and with Dr. Doristine Bosworth.  2.  I will sign off.  Please call me if questions arise or there is evidence of further bleeding. Please see yesterday's note regarding recommendations pertaining to resumption of anticoagulation and office follow-up.  Cleotis Nipper, M.D. Pager [phone] If no answer or after 5 PM call [phone]

## 2021-01-01 DIAGNOSIS — K922 Gastrointestinal hemorrhage, unspecified: Secondary | ICD-10-CM | POA: Diagnosis not present

## 2021-01-01 LAB — CBC
HCT: 31.3 % — ABNORMAL LOW (ref 36.0–46.0)
Hemoglobin: 8.5 g/dL — ABNORMAL LOW (ref 12.0–15.0)
MCH: 19.5 pg — ABNORMAL LOW (ref 26.0–34.0)
MCHC: 27.2 g/dL — ABNORMAL LOW (ref 30.0–36.0)
MCV: 72 fL — ABNORMAL LOW (ref 80.0–100.0)
Platelets: 252 10*3/uL (ref 150–400)
RBC: 4.35 MIL/uL (ref 3.87–5.11)
RDW: 26.7 % — ABNORMAL HIGH (ref 11.5–15.5)
WBC: 6.1 10*3/uL (ref 4.0–10.5)
nRBC: 0 % (ref 0.0–0.2)

## 2021-01-01 MED ORDER — ELIQUIS 5 MG PO TABS: 5.0000 mg | ORAL_TABLET | Freq: Two times a day (BID) | ORAL | 0 refills | Status: AC

## 2021-01-01 NOTE — Discharge Summary (Signed)
Physician Discharge Summary  [name] KXF:[id number] DOB: [birth year] DOA: 12/27/2020  PCP: Caryl Bis, MD  Admit date: 12/27/2020 Discharge date: 01/01/2021 30 Day Unplanned Readmission Risk Score   Flowsheet Row Admission (Current) from 12/27/2020 in [place]  30 Day Unplanned Readmission Risk Score (%) 9.2 Filed at 12/30/2020 0801     This score is the patient's risk of an unplanned readmission within 30 days of being discharged (0 -100%). The score is based on dignosis, age, lab data, medications, orders, and past utilization.   Low:  0-14.9   Medium: 15-21.9   High: 22-29.9   Extreme: 30 and above         Admitted From: Home Disposition: Home  Recommendations for Outpatient Follow-up:  1. Follow up with PCP in 1-2 weeks 2. Please hold Eliquis for next 3 days and start taking them on Wednesday if no evidence of active bleeding. 3. Please obtain BMP/CBC in one week 4. Please follow up with your PCP on the following pending results: Unresulted Labs (From admission, onward)         None        Home Health: None Equipment/Devices: None  Discharge Condition: Stable CODE STATUS: Full code Diet recommendation: Cardiac  Subjective: Seen and examined.  No complaints.  No evidence of rectal bleeding since hospitalization.  She is ready to go home.  Brief/Interim Summary: Jennetta Flood Watkinsis a 75 y.o.femalewith medical history significant for atrial fibrillation on Eliquis, HTN, [name], [name]  Reportedly she was seen by her PCP a few days ago and had a low Hgb of 6.8 and was instructed to stop eliquis and follow up with GI. She did not go to GI. She developed fatigue and SOB with excertion and her husband took her to the ER. Hgb in ER was found to be 6.1.  She does not remember if she stopped her the eliquis. She was given Kcentra in the ER. Given one unit PRBC in ER and  transferred to [facility] for GI evaluation.  She had CTA of abdomen and pelvis at ER which did not show any active bleeding.  Upon arrival to [place], her hemoglobin was 6.5.  She received 2 more units of PRBCs transfusion on 12/28/2020.  Her hemoglobin improved to 9.1 and remained stable for past 3 days.  Patient has not had any rectal bleeding since admission which has been 3 days now.  She was seen by GI.  Her bleeding was thought to be secondary to diverticular bleeding which was expected to stop by itself and thus GI did not recommend colonoscopy but recommended watchful waiting.  GI has signed off now that she has no further episodes of bleeding.  However they recommended to continue to hold her Eliquis for few more days and resume if no signs of bleeding.  Patient was being discharged on 12/30/2020 and while she was getting out of the room, she had a rectal bleeding.  I discussed with GI at that time who assumed that patient's bleeding once again was likely diverticular and recommended keeping patient in the hospital, resetting the clock for another 72 hours.  Patient was seen by GI yesterday and per their examination, they now opined that patient's bleeding on 12/30/2020 was likely hemorrhoidal as her hemoglobin remained stable.  Patient has not had any further rectal bleeding last 2 days and she has no other complaints.  GI has cleared the patient for discharge today and patient is in agreement as well.  She has been advised to hold off to her Eliquis for next 3 days and resume on Wednesday if no rectal bleeding.  She verbalized understanding.  Discharge Diagnoses:  Principal Problem:   Lower GI bleed Active Problems:   Essential hypertension, benign   Persistent atrial fibrillation (HCC)   Anemia due to blood loss    Discharge Instructions   Allergies as of 01/01/2021      Reactions   Codeine Other (See Comments)   REACTION: Hallucinations      Medication List    TAKE these  medications   alendronate 70 MG tablet Commonly known as: FOSAMAX Take 70 mg by mouth once a week. Notes to patient: Please follow home schedule for this Medication which is taken Once a Week    Calcium + Vitamin D3 600-400 MG-UNIT Tabs Generic drug: Calcium Carbonate-Vitamin D3 Take 1 tablet by mouth 2 (two) times daily.   CoQ-10 100 MG Caps Take 100 mg by mouth daily.   diltiazem 120 MG 24 hr capsule Commonly known as: CARDIZEM CD Take 120 mg by mouth daily. Notes to patient: Last Dose of this Medication is to be taken once Daily. Last Dose of this Medication was given on 527/2022 at 10:12 am   Eliquis 5 MG Tabs tablet Generic drug: apixaban Take 1 tablet (5 mg total) by mouth 2 (two) times daily. Start taking on: January 04, 2021 What changed:   how much to take  These instructions start on January 04, 2021. If you are unsure what to do until then, ask your doctor or other care provider.   furosemide 40 MG tablet Commonly known as: LASIX Take 40 mg by mouth 2 (two) times daily. Notes to patient: Last Dose of this Medication was given on Dec 30, 2020 at 7:44 am. Please take a second dose of this Medication this evening.   metoprolol tartrate 25 MG tablet Commonly known as: LOPRESSOR TAKE 1&1/2 TABLETS BY MOUTH TWICE DAILY What changed: See the new instructions. Notes to patient: Last Dose of this Medication was given on 12/30/2020 at 10:12 am This Medication is be taken twice daily. Please take another dose tonight   pantoprazole 40 MG tablet Commonly known as: PROTONIX Take 40 mg by mouth daily. Notes to patient: Last Dose of this Medication was given on 12/30/2020 at 10:12 am   potassium chloride SA 20 MEQ tablet Commonly known as: KLOR-CON TAKE (1) TABLET BY MOUTH ONCE DAILY. What changed: See the new instructions. Notes to patient: Last Dose of this Medication was given on Dec 30, 2020 at 10:12 am    pravastatin 20 MG tablet Commonly known as: PRAVACHOL Take 20 mg  by mouth every evening. Notes to patient: Please take this Medication this evening Dec 30, 2020    sertraline 50 MG tablet Commonly known as: ZOLOFT Take 50 mg by mouth daily.   Vitamin D3 25 MCG (1000 UT) Caps Take 1,000 Units by mouth daily.       Follow-up Information    Caryl Bis, MD Follow up in 1 week(s).   Specialty: Family Medicine Contact information: [place] [address] [phone]        Herminio Commons, MD .   Specialty: Cardiology Contact information: [place] [address] [phone]              Allergies  Allergen Reactions  .  Codeine Other (See Comments)    REACTION: Hallucinations    Consultations: GI   Procedures/Studies: No results found.   Discharge Exam: Vitals:   12/31/20 2122 01/01/21 0613  BP: (!) 110/55 128/63  Pulse: 86 88  Resp: (!) 22 20  Temp: 98.3 F (36.8 C) 98.6 F (37 C)  SpO2: 92% 93%   Vitals:   12/31/20 0836 12/31/20 0847 12/31/20 2122 01/01/21 0613  BP: (!) 111/57 (!) 111/57 (!) 110/55 128/63  Pulse: 91 91 86 88  Resp:  12 (!) 22 20  Temp:   98.3 F (36.8 C) 98.6 F (37 C)  TempSrc:   Oral Oral  SpO2:  90% 92% 93%  Weight:      Height:        General exam: Appears calm and comfortable  Respiratory system: Clear to auscultation. Respiratory effort normal. Cardiovascular system: S1 & S2 heard, RRR. No JVD, murmurs, rubs, gallops or clicks. No pedal edema. Gastrointestinal system: Abdomen is nondistended, soft and nontender. No organomegaly or masses felt. Normal bowel sounds heard. Central nervous system: Alert and oriented. No focal neurological deficits. Extremities: Symmetric 5 x 5 power. Skin: No rashes, lesions or ulcers.  Psychiatry: Judgement and insight appear normal. Mood & affect appropriate.    The results of significant diagnostics from this hospitalization (including imaging, microbiology, ancillary and laboratory) are listed below for reference.      Microbiology: No results found for this or any previous visit (from the past 240 hour(s)).   Labs: BNP (last 3 results) No results for input(s): BNP in the last 8760 hours. Basic Metabolic Panel: Recent Labs  Lab 12/28/20 0452 12/29/20 0807  NA 138 136  K 3.8 3.7  CL 104 102  CO2 26 28  GLUCOSE 91 110*  BUN 11 7*  CREATININE 0.64 0.60  CALCIUM 8.4* 8.3*   Liver Function Tests: Recent Labs  Lab 12/28/20 0452  AST 31  ALT 18  ALKPHOS 55  BILITOT 1.7*  PROT 5.7*  ALBUMIN 2.8*   No results for input(s): LIPASE, AMYLASE in the last 168 hours. No results for input(s): AMMONIA in the last 168 hours. CBC: Recent Labs  Lab 12/30/20 0522 12/30/20 1213 12/30/20 1652 12/31/20 0852 01/01/21 0442  WBC 6.1 6.7 7.7 6.4 6.1  HGB 9.0* 8.9* 8.9* 8.9* 8.5*  HCT 33.2* 32.0* 32.5* 32.6* 31.3*  MCV 71.7* 71.6* 71.9* 71.3* 72.0*  PLT 268 253 285 272 252   Cardiac Enzymes: No results for input(s): CKTOTAL, CKMB, CKMBINDEX, TROPONINI in the last 168 hours. BNP: Invalid input(s): POCBNP CBG: No results for input(s): GLUCAP in the last 168 hours. D-Dimer No results for input(s): DDIMER in the last 72 hours. Hgb A1c No results for input(s): HGBA1C in the last 72 hours. Lipid Profile No results for input(s): CHOL, HDL, LDLCALC, TRIG, CHOLHDL, LDLDIRECT in the last 72 hours. Thyroid function studies No results for input(s): TSH, T4TOTAL, T3FREE, THYROIDAB in the last 72 hours.  Invalid input(s): FREET3 Anemia work up No results for input(s): VITAMINB12, FOLATE, FERRITIN, TIBC, IRON, RETICCTPCT in the last 72 hours. Urinalysis No results found for: COLORURINE, APPEARANCEUR, LABSPEC, [place], GLUCOSEU, HGBUR, BILIRUBINUR, KETONESUR, PROTEINUR, UROBILINOGEN, NITRITE, LEUKOCYTESUR Sepsis Labs Invalid input(s): PROCALCITONIN,  WBC,  LACTICIDVEN Microbiology No results found for this or any previous visit (from the past 240 hour(s)).   Time coordinating discharge: Over 30  minutes  SIGNED:   Darliss Cheney, MD  [facility] 01/01/2021, 10:05 AM  If 7PM-7AM, please contact night-coverage [email or web address]

## 2021-01-01 NOTE — Discharge Instructions (Signed)

## 2021-01-01 NOTE — Progress Notes (Signed)
Occupational Therapy Treatment Patient Details Name: [name] MRN: [id number] DOB: [birth year] Today's Date: 01/01/2021    History of present illness 75 y.o. female who presented to ER with blood per rectum. Dx of GIB. Pt with medical history significant for atrial fibrillation, HTN, [name], hx of CVA   OT comments  Patient progressing well with functional mobility supervision to light min G off of lower toilet and verbal cues for safe hand placement. Patient looking forward to going home today, appears more steady and with increased stamina compared to previous OT session. Continue to recommend 24/7 supervision at least initial, note patient with memory deficits such as cannot remember if she has been calling in her meals vs automatic trays.    Follow Up Recommendations  Supervision/Assistance - 24 hour;No OT follow up    Equipment Recommendations  None recommended by OT       Precautions / Restrictions Precautions Precautions: Fall Precaution Comments: h/o 1 fall in past 1 year, fell 6 months ago sustaining a R hip fx (got up to walk and didn't realize her leg was asleep)       Mobility Bed Mobility Overal bed mobility: Needs Assistance Bed Mobility: Supine to Sit     Supine to sit: Supervision;HOB elevated     General bed mobility comments: increased time    Transfers Overall transfer level: Needs assistance Equipment used: Rolling walker (2 wheeled) Transfers: Sit to/from Stand Sit to Stand: Min guard;From elevated surface         General transfer comment: cues for hand placement    Balance Overall balance assessment: Needs assistance Sitting-balance support: Feet supported Sitting balance-Leahy Scale: Good     Standing balance support: No upper extremity supported Standing balance-Leahy Scale: Fair Standing balance comment: to perform g/h at sink                           ADL either performed or assessed with clinical judgement   ADL  Overall ADL's : Needs assistance/impaired     Grooming: Wash/dry face;Wash/dry hands;Brushing hair;Supervision/safety;Standing Grooming Details (indicate cue type and reason): no loss of balance at sink this morning for g/h             Lower Body Dressing: Total assistance;Sitting/lateral leans Lower Body Dressing Details (indicate cue type and reason): don socks, usually does not wear at home or uses sockaid Toilet Transfer: Min guard;Ambulation;RW;BSC;Cueing for safety Toilet Transfer Details (indicate cue type and reason): needs verbal cues for sit to stand to pull on grab bar vs walker, light min G coming up from lower toilet Toileting- Clothing Manipulation and Hygiene: Supervision/safety;Sit to/from stand Toileting - Clothing Manipulation Details (indicate cue type and reason): peri care after voiding     Functional mobility during ADLs: Supervision/safety;Cueing for safety;Rolling walker                 Cognition Arousal/Alertness: Awake/alert Behavior During Therapy: WFL for tasks assessed/performed Overall Cognitive Status: No family/caregiver present to determine baseline cognitive functioning                                 General Comments: follows directions appropriately but appears to have memory deficits, cannot recall if trays come automatically to her room, ect                   Pertinent Vitals/ Pain  Pain Assessment: No/denies pain         Frequency  Min 2X/week        Progress Toward Goals  OT Goals(current goals can now be found in the care plan section)  Progress towards OT goals: Progressing toward goals  Acute Rehab OT Goals Patient Stated Goal: go home soon OT Goal Formulation: With patient Time For Goal Achievement: 01/12/21 Potential to Achieve Goals: Good ADL Goals Pt Will Perform Lower Body Dressing: with adaptive equipment;sit to/from stand;sitting/lateral leans;with supervision Pt Will Transfer to  Toilet: with supervision;ambulating (walker, comfort height toilet) Pt Will Perform Toileting - Clothing Manipulation and hygiene: with supervision;sit to/from stand;sitting/lateral leans Additional ADL Goal #1: Patient will be supervision level for standing activity for 8 minutes in order to safely participate in daily routine.  Plan Discharge plan remains appropriate       AM-PAC OT "6 Clicks" Daily Activity     Outcome Measure   Help from another person eating meals?: None Help from another person taking care of personal grooming?: A Little Help from another person toileting, which includes using toliet, bedpan, or urinal?: A Little Help from another person bathing (including washing, rinsing, drying)?: A Lot Help from another person to put on and taking off regular upper body clothing?: A Little Help from another person to put on and taking off regular lower body clothing?: Total 6 Click Score: 16    End of Session Equipment Utilized During Treatment: Rolling walker  OT Visit Diagnosis: Unsteadiness on feet (R26.81)   Activity Tolerance Patient tolerated treatment well   Patient Left in chair;with call bell/phone within reach;with chair alarm set   Nurse Communication Mobility status        Time: 0722-0745 OT Time Calculation (min): 23 min  Charges: OT General Charges $OT Visit: 1 Visit OT Treatments $Self Care/Home Management : 23-37 mins  Delbert Phenix OT OT pager: [place] 01/01/2021, 7:51 AM

## 2021-01-01 NOTE — Progress Notes (Signed)
Discharge delayed. Patient endorses family member unable to get patient until, they get off work. Endorsed understanding.

## 2021-01-05 DIAGNOSIS — D529 Folate deficiency anemia, unspecified: Secondary | ICD-10-CM | POA: Diagnosis not present

## 2021-01-05 DIAGNOSIS — D62 Acute posthemorrhagic anemia: Secondary | ICD-10-CM | POA: Diagnosis not present

## 2021-01-05 DIAGNOSIS — I5032 Chronic diastolic (congestive) heart failure: Secondary | ICD-10-CM | POA: Diagnosis not present

## 2021-01-05 DIAGNOSIS — G8194 Hemiplegia, unspecified affecting left nondominant side: Secondary | ICD-10-CM | POA: Diagnosis not present

## 2021-01-05 DIAGNOSIS — I482 Chronic atrial fibrillation, unspecified: Secondary | ICD-10-CM | POA: Diagnosis not present

## 2021-01-05 DIAGNOSIS — F331 Major depressive disorder, recurrent, moderate: Secondary | ICD-10-CM | POA: Diagnosis not present

## 2021-01-05 DIAGNOSIS — D510 Vitamin B12 deficiency anemia due to intrinsic factor deficiency: Secondary | ICD-10-CM | POA: Diagnosis not present

## 2021-01-05 DIAGNOSIS — Z6833 Body mass index (BMI) 33.0-33.9, adult: Secondary | ICD-10-CM | POA: Diagnosis not present

## 2021-01-10 DIAGNOSIS — I781 Nevus, non-neoplastic: Secondary | ICD-10-CM | POA: Diagnosis not present

## 2021-01-10 DIAGNOSIS — L570 Actinic keratosis: Secondary | ICD-10-CM | POA: Diagnosis not present

## 2021-01-10 DIAGNOSIS — D485 Neoplasm of uncertain behavior of skin: Secondary | ICD-10-CM | POA: Diagnosis not present

## 2021-01-10 DIAGNOSIS — L304 Erythema intertrigo: Secondary | ICD-10-CM | POA: Diagnosis not present

## 2021-01-10 DIAGNOSIS — Z1283 Encounter for screening for malignant neoplasm of skin: Secondary | ICD-10-CM | POA: Diagnosis not present

## 2021-01-10 DIAGNOSIS — D224 Melanocytic nevi of scalp and neck: Secondary | ICD-10-CM | POA: Diagnosis not present

## 2021-01-10 DIAGNOSIS — L821 Other seborrheic keratosis: Secondary | ICD-10-CM | POA: Diagnosis not present

## 2021-01-12 DIAGNOSIS — D649 Anemia, unspecified: Secondary | ICD-10-CM | POA: Diagnosis not present

## 2021-01-12 DIAGNOSIS — K625 Hemorrhage of anus and rectum: Secondary | ICD-10-CM | POA: Diagnosis not present

## 2021-01-12 DIAGNOSIS — K644 Residual hemorrhoidal skin tags: Secondary | ICD-10-CM | POA: Diagnosis not present

## 2021-01-25 DIAGNOSIS — K2970 Gastritis, unspecified, without bleeding: Secondary | ICD-10-CM | POA: Diagnosis not present

## 2021-01-25 DIAGNOSIS — K5730 Diverticulosis of large intestine without perforation or abscess without bleeding: Secondary | ICD-10-CM | POA: Diagnosis not present

## 2021-01-25 DIAGNOSIS — Z7901 Long term (current) use of anticoagulants: Secondary | ICD-10-CM | POA: Diagnosis not present

## 2021-01-25 DIAGNOSIS — D649 Anemia, unspecified: Secondary | ICD-10-CM | POA: Diagnosis not present

## 2021-01-25 DIAGNOSIS — I8500 Esophageal varices without bleeding: Secondary | ICD-10-CM | POA: Diagnosis not present

## 2021-01-25 DIAGNOSIS — K648 Other hemorrhoids: Secondary | ICD-10-CM | POA: Diagnosis not present

## 2021-01-25 DIAGNOSIS — K2980 Duodenitis without bleeding: Secondary | ICD-10-CM | POA: Diagnosis not present

## 2021-01-25 DIAGNOSIS — K209 Esophagitis, unspecified without bleeding: Secondary | ICD-10-CM | POA: Diagnosis not present

## 2021-01-25 DIAGNOSIS — L539 Erythematous condition, unspecified: Secondary | ICD-10-CM | POA: Diagnosis not present

## 2021-01-25 DIAGNOSIS — E785 Hyperlipidemia, unspecified: Secondary | ICD-10-CM | POA: Diagnosis not present

## 2021-01-25 DIAGNOSIS — K529 Noninfective gastroenteritis and colitis, unspecified: Secondary | ICD-10-CM | POA: Diagnosis not present

## 2021-01-25 DIAGNOSIS — K644 Residual hemorrhoidal skin tags: Secondary | ICD-10-CM | POA: Diagnosis not present

## 2021-01-25 DIAGNOSIS — I10 Essential (primary) hypertension: Secondary | ICD-10-CM | POA: Diagnosis not present

## 2021-01-25 DIAGNOSIS — K625 Hemorrhage of anus and rectum: Secondary | ICD-10-CM | POA: Diagnosis not present

## 2021-01-25 DIAGNOSIS — D509 Iron deficiency anemia, unspecified: Secondary | ICD-10-CM | POA: Diagnosis not present

## 2021-01-25 DIAGNOSIS — K922 Gastrointestinal hemorrhage, unspecified: Secondary | ICD-10-CM | POA: Diagnosis not present

## 2021-01-25 DIAGNOSIS — I4891 Unspecified atrial fibrillation: Secondary | ICD-10-CM | POA: Diagnosis not present

## 2021-01-25 DIAGNOSIS — I422 Other hypertrophic cardiomyopathy: Secondary | ICD-10-CM | POA: Diagnosis not present

## 2021-01-31 ENCOUNTER — Ambulatory Visit (INDEPENDENT_AMBULATORY_CARE_PROVIDER_SITE_OTHER): Payer: Medicare Other | Admitting: Student

## 2021-01-31 ENCOUNTER — Encounter: Payer: Self-pay | Admitting: Student

## 2021-01-31 ENCOUNTER — Other Ambulatory Visit: Payer: Self-pay

## 2021-01-31 VITALS — BP 134/86 | HR 77 | Ht 64.0 in | Wt 176.6 lb

## 2021-01-31 DIAGNOSIS — I482 Chronic atrial fibrillation, unspecified: Secondary | ICD-10-CM | POA: Diagnosis not present

## 2021-01-31 DIAGNOSIS — F331 Major depressive disorder, recurrent, moderate: Secondary | ICD-10-CM | POA: Diagnosis not present

## 2021-01-31 DIAGNOSIS — G8194 Hemiplegia, unspecified affecting left nondominant side: Secondary | ICD-10-CM | POA: Diagnosis not present

## 2021-01-31 DIAGNOSIS — Z8719 Personal history of other diseases of the digestive system: Secondary | ICD-10-CM | POA: Diagnosis not present

## 2021-01-31 DIAGNOSIS — I5032 Chronic diastolic (congestive) heart failure: Secondary | ICD-10-CM

## 2021-01-31 DIAGNOSIS — Z79899 Other long term (current) drug therapy: Secondary | ICD-10-CM | POA: Diagnosis not present

## 2021-01-31 DIAGNOSIS — I340 Nonrheumatic mitral (valve) insufficiency: Secondary | ICD-10-CM

## 2021-01-31 DIAGNOSIS — D500 Iron deficiency anemia secondary to blood loss (chronic): Secondary | ICD-10-CM

## 2021-01-31 DIAGNOSIS — I4821 Permanent atrial fibrillation: Secondary | ICD-10-CM

## 2021-01-31 DIAGNOSIS — R600 Localized edema: Secondary | ICD-10-CM

## 2021-01-31 DIAGNOSIS — Z7901 Long term (current) use of anticoagulants: Secondary | ICD-10-CM | POA: Diagnosis not present

## 2021-01-31 DIAGNOSIS — D62 Acute posthemorrhagic anemia: Secondary | ICD-10-CM | POA: Diagnosis not present

## 2021-01-31 MED ORDER — FUROSEMIDE 40 MG PO TABS: 40.0000 mg | ORAL_TABLET | Freq: Two times a day (BID) | ORAL | 11 refills | Status: AC

## 2021-01-31 NOTE — Progress Notes (Signed)
Cardiology Office Note    Date:  01/31/2021   ID:  [name], [name] [birth year], MRN [id number]  PCP:  Caryl Bis, MD  Cardiologist: Previously Dr. Bronson Ing --> Needs to switch to new MD  Chief Complaint  Patient presents with   Hospitalization Follow-up    History of Present Illness:    [name] is a 75 y.o. female with past medical history of persistent atrial fibrillation, hypertensive cardiomyopathy, HTN, HLD, history of GIB and prior CVA who presents to the office today for hospital follow-up.   She most recently had a telehealth visit with Dr. Bronson Ing in 09/2019 and denied any recent chest pain or palpitations at that time. Notes mentioned plans to obtain a repeat echocardiogram given her moderate to severe MR by echo in 2019 but it appears this was not obtained.   In the interim, she was admitted to [facility] in 12/2020 as a transfer from [facility] due to a GIB. Hgb was at 6.1 on admission and was at 6.5 upon arrival to [facility]. She received an additional 2 units pRBC's and GI felt her bleeding was likely due to a diverticular bleed and further intervention was not pursued. She did have intermittent hemorrhoidal bleeding as well. GI did recommend holding Eliquis for at least 3 days from hospital discharge.   In talking with the patient and her daughter today, she reports still having dyspnea on exertion and feels fatigued since her admission last month. She has been followed by GI and underwent Endoscopy and Colonoscopy last week at [facility]. The report they provided today showed she had scattered erosions along her ascending colon and multiple small and large mouth diverticuli. She denies any evidence of recurrent bleeding but is unsure if repeat labs have been checked by her PCP or GI recently (none are available for review in Care Everywhere). She denies any specific chest pain or palpitations. She has experienced worsening lower extremity  edema but no orthopnea or PND. Her daughter does ask if she would be a candidate for a Watchman device as this was previously discussed in the past given her recurrent GI bleeds.   Past Medical History:  Diagnosis Date   [name] (coronary artery disease) 2008   mild   Dysrhythmia    AFib   Hypertensive cardiovascular disease    Left atrial dilatation 2013   Obesity    Persistent atrial fibrillation ([place])    Stroke (cerebrum) ([place])    short term memory loss    Past Surgical History:  Procedure Laterality Date   ABDOMINAL HYSTERECTOMY     ABDOMINAL HYSTERECTOMY     BIOPSY THYROID     x2   CARDIOVERSION N/A 07/22/2014   Procedure: CARDIOVERSION;  Surgeon: Sueanne Margarita, MD;  Location: [facility];  Service: Cardiovascular;  Laterality: N/A;   CATARACT EXTRACTION W/PHACO Left 05/19/2018   Procedure: CATARACT EXTRACTION PHACO AND INTRAOCULAR LENS PLACEMENT ([place]);  Surgeon: Tonny Branch, MD;  Location: [facility];  Service: Ophthalmology;  Laterality: Left;  CDE: 12.99   CATARACT EXTRACTION W/PHACO Right 06/02/2018   Procedure: CATARACT EXTRACTION PHACO AND INTRAOCULAR LENS PLACEMENT RIGHT EYE CDE=7.18;  Surgeon: Tonny Branch, MD;  Location: [facility];  Service: Ophthalmology;  Laterality: Right;  right   LEFT HEART CATHETERIZATION WITH CORONARY ANGIOGRAM N/A 07/19/2014   Procedure: LEFT HEART CATHETERIZATION WITH CORONARY ANGIOGRAM;  Surgeon: Blane Ohara, MD;  Location: [facility] CATH LAB;  Service: Cardiovascular;  Laterality: N/A;   TEE WITHOUT CARDIOVERSION  N/A 07/22/2014   Procedure: TRANSESOPHAGEAL ECHOCARDIOGRAM (TEE);  Surgeon: Sueanne Margarita, MD;  Location: [facility] ENDOSCOPY;  Service: Cardiovascular;  Laterality: N/A;   THYROIDECTOMY, PARTIAL      Current Medications: Outpatient Medications Prior to Visit  Medication Sig Dispense Refill   apixaban (ELIQUIS) 5 MG TABS tablet Take 1 tablet (5 mg total) by mouth 2 (two) times daily. 60 tablet 0   CALCIUM + VITAMIN D3 600-10 MG-MCG TABS Take 1  tablet by mouth 2 (two) times daily.     Cholecalciferol (VITAMIN D3) 25 MCG (1000 UT) CAPS Take 1,000 Units by mouth daily.     Coenzyme Q10 (COQ-10) 100 MG CAPS Take 100 mg by mouth daily.     diltiazem (CARDIZEM CD) 120 MG 24 hr capsule Take 120 mg by mouth daily.     metoprolol tartrate (LOPRESSOR) 25 MG tablet TAKE 1&1/2 TABLETS BY MOUTH TWICE DAILY (Patient taking differently: Take 37.5 mg by mouth 2 (two) times daily.) 84 tablet 11   pantoprazole (PROTONIX) 40 MG tablet Take 40 mg by mouth daily.      pravastatin (PRAVACHOL) 20 MG tablet Take 20 mg by mouth every evening.      sertraline (ZOLOFT) 50 MG tablet Take 50 mg by mouth daily.     furosemide (LASIX) 40 MG tablet Take 40 mg by mouth 2 (two) times daily.     potassium chloride SA (K-DUR) 20 MEQ tablet TAKE (1) TABLET BY MOUTH ONCE DAILY. (Patient not taking: Reported on 01/31/2021) 30 tablet 6   alendronate (FOSAMAX) 70 MG tablet Take 70 mg by mouth once a week.     No facility-administered medications prior to visit.     Allergies:   Codeine   Social History   Socioeconomic History   Marital status: Married    Spouse name: Not on file   Number of children: Not on file   Years of education: Not on file   Highest education level: Not on file  Occupational History   Occupation: Retired  Tobacco Use   Smoking status: Never   Smokeless tobacco: Never  Vaping Use   Vaping Use: Never used  Substance and Sexual Activity   Alcohol use: No    Alcohol/week: 0.0 standard drinks   Drug use: No   Sexual activity: Yes    Birth control/protection: Surgical  Other Topics Concern   Not on file  Social History Narrative   Lives in [place]   Retired   Social Determinants of Health   Financial Resource Strain: Not on file  Food Insecurity: Not on file  Transportation Needs: Not on file  Physical Activity: Not on file  Stress: Not on file  Social Connections: Not on file     Family History:  The patient's family history  includes Emphysema in her mother; Hypertension in an other family member.   Review of Systems:    Please see the history of present illness.     All other systems reviewed and are otherwise negative except as noted above.   Physical Exam:    VS:  BP 134/86   Pulse 77   Ht 5\' 4"  (1.626 m)   Wt 176 lb 9.6 oz (80.1 kg)   SpO2 93%   BMI 30.31 kg/m    General: Well developed, well nourished,female appearing in no acute distress. Head: Normocephalic, atraumatic. Neck: No carotid bruits. JVD not elevated.  Lungs: Respirations regular and unlabored, without wheezes or rales.  Heart: Irregularly irregular No S3 or  S4. 2/6 SEM along RUSB.  Abdomen: Appears non-distended. No obvious abdominal masses. Msk:  Strength and tone appear normal for age. No obvious joint deformities or effusions. Extremities: No clubbing or cyanosis. 2+ pitting edema up to mid-shins bilaterally.   Distal pedal pulses are 2+ bilaterally. Neuro: Alert and oriented X 3. Moves all extremities spontaneously. No focal deficits noted. Psych:  Responds to questions appropriately with a normal affect. Skin: No rashes or lesions noted  Wt Readings from Last 3 Encounters:  01/31/21 176 lb 9.6 oz (80.1 kg)  12/27/20 189 lb 6 oz (85.9 kg)  09/23/19 203 lb (92.1 kg)     Studies/Labs Reviewed:   EKG:  EKG is not ordered today.  Recent Labs: 12/28/2020: ALT 18 12/29/2020: BUN 7; Creatinine, Ser 0.60; Potassium 3.7; Sodium 136 01/01/2021: Hemoglobin 8.5; Platelets 252   Lipid Panel    Component Value Date/Time   CHOL 146 07/19/2014 0213   TRIG 234 (H) 07/19/2014 0213   HDL 32 (L) 07/19/2014 0213   CHOLHDL 4.6 07/19/2014 0213   VLDL 47 (H) 07/19/2014 0213   LDLCALC 67 07/19/2014 0213    Additional studies/ records that were reviewed today include:   Echocardiogram: 07/2015 Study Conclusions   - Left ventricle: The cavity size was normal. There was mild    concentric hypertrophy. Systolic function was normal.  The    estimated ejection fraction was in the range of 60% to 65%. Wall    motion was normal; there were no regional wall motion    abnormalities. The study was not technically sufficient to allow    evaluation of LV diastolic dysfunction due to atrial    fibrillation. Doppler parameters are consistent with high    ventricular filling pressure.  - Aortic valve: Mildly to moderately calcified annulus. Mildly    thickened leaflets.  - Mitral valve: Moderately to severely calcified annulus. There was    moderate regurgitation.  - Left atrium: The atrium was mildly to moderately dilated.  - Tricuspid valve: There was mild regurgitation.   Echocardiogram: 05/2020 INTERPRETATION  NORMAL LEFT VENTRICULAR SYSTOLIC FUNCTION   WITH MILD LVH  NORMAL RIGHT VENTRICULAR SYSTOLIC FUNCTION  TRIVIAL REGURGITATION NOTED (See above)  MILD VALVULAR STENOSIS (See above)  NEGATIVE SALINE CONTRAST STUDY  MODERATE PULMONARY HYPERENSION - RVSP= 53.7 mmHg  TECHNCIALLY DIFFICULT STUDY   Assessment:    1. Permanent atrial fibrillation ([place])   2. Current use of long term anticoagulation   3. Chronic diastolic (congestive) heart failure ([place])   4. Lower extremity edema   5. Medication management   6. Moderate mitral regurgitation   7. History of GI bleed   8. Anemia due to blood loss      Plan:   In order of problems listed above:  1. Permanent Atrial Fibrillation - She denies any recent palpitations and her HR is well-controlled in the 70's today. Continue Cardizem CD 120mg  daily and Lopressor 37.5mg  BID for rate-control. - She is currently on Eliquis 5mg  BID for anticoagulation which is the appropriate dose at this time given her age, weight and renal function. She has experienced multiple GI bleeds in the past with the most recent being in 12/2020 and felt secondary to diverticular bleeding and required transfusions. A Watchman device was previously reviewed but not pursued in the past but the  patient and her daughter are interested in seeing if she would be a candidate. Will refer to Structural Heart for further evaluation.   2. HFpEF/Lower Extremity Edema -  She has experienced worsening lower extremity edema since her hospitalization but denies any orthopnea or PND. Reports her weight has been stable on her home scales. She is listed in our records as taking Lasix 40mg  BID but records from [facility] had her listed as taking 40mg  daily (which she has been taking for at least the past month). Will titrate to BID dosing. Recheck BNP and BMET in 2 weeks. Will also update her echocardiogram given prior imaging in [facility] last year had limited views.   3. Valvular Heart Disease - Prior echo in 2016 showed moderate MR and mild TR with echo in 2021 showing mild AS and trivial MR but images were limited. Will update an echocardiogram as discussed above.   4. Anemia/GIB - She is being followed by Dr. Rocco Serene at [facility] and recently underwent Endoscopy and Colonoscopy last week which showed scattered erosions along her ascending colon and multiple small and large mouth diverticuli.  - She denies any evidence of active bleeding. Will recheck CBC with upcoming labs.     Medication Adjustments/Labs and Tests Ordered: Current medicines are reviewed at length with the patient today.  Concerns regarding medicines are outlined above.  Medication changes, Labs and Tests ordered today are listed in the Patient Instructions below. Patient Instructions  Medication Instructions:  Your physician has recommended you make the following change in your medication:   Increase Lasix to 40 mg Two Times Daily   *If you need a refill on your cardiac medications before your next appointment, please call your pharmacy*   Lab Work: Your physician recommends that you return for lab work in: 2 [place] (02/14/21)   If you have labs (blood work) drawn today and your tests are completely normal, you will receive your  results only by: [place] (if you have MyChart) OR A paper copy in the mail If you have any lab test that is abnormal or we need to change your treatment, we will call you to review the results.   Testing/Procedures: Your physician has requested that you have an echocardiogram. Echocardiography is a painless test that uses sound waves to create images of your heart. It provides your doctor with information about the size and shape of your heart and how well your heart's chambers and valves are working. This procedure takes approximately one hour. There are no restrictions for this procedure.    Follow-Up: At [facility], you and your health needs are our priority.  As part of our continuing mission to provide you with exceptional heart care, we have created designated Provider Care Teams.  These Care Teams include your primary Cardiologist (physician) and Advanced Practice Providers (APPs -  Physician Assistants and Nurse Practitioners) who all work together to provide you with the care you need, when you need it.  We recommend signing up for the patient portal called "MyChart".  Sign up information is provided on this After Visit Summary.  MyChart is used to connect with patients for Virtual Visits (Telemedicine).  Patients are able to view lab/test results, encounter notes, upcoming appointments, etc.  Non-urgent messages can be sent to your provider as well.   To learn more about what you can do with MyChart, go to [email or web address].    Your next appointment:   4-5 week(s)  The format for your next appointment:   In Person  Provider:   You will see one of the following Advanced Practice Providers on your designated Care Team:   [place], PA-C  Ermalinda Barrios,  PA-C    Other Instructions Thank you for choosing [facility]!     Signed, Erma Heritage, PA-C  01/31/2021 7:47 PM    [facility] [address]  [place], [place] [address] Phone: [phone] Fax: [phone]

## 2021-01-31 NOTE — Patient Instructions (Signed)
Medication Instructions:  Your physician has recommended you make the following change in your medication:   Increase Lasix to 40 mg Two Times Daily   *If you need a refill on your cardiac medications before your next appointment, please call your pharmacy*   Lab Work: Your physician recommends that you return for lab work in: 2 [place] (02/14/21)   If you have labs (blood work) drawn today and your tests are completely normal, you will receive your results only by: [place] (if you have MyChart) OR A paper copy in the mail If you have any lab test that is abnormal or we need to change your treatment, we will call you to review the results.   Testing/Procedures: Your physician has requested that you have an echocardiogram. Echocardiography is a painless test that uses sound waves to create images of your heart. It provides your doctor with information about the size and shape of your heart and how well your heart's chambers and valves are working. This procedure takes approximately one hour. There are no restrictions for this procedure.    Follow-Up: At [facility], you and your health needs are our priority.  As part of our continuing mission to provide you with exceptional heart care, we have created designated Provider Care Teams.  These Care Teams include your primary Cardiologist (physician) and Advanced Practice Providers (APPs -  Physician Assistants and Nurse Practitioners) who all work together to provide you with the care you need, when you need it.  We recommend signing up for the patient portal called "MyChart".  Sign up information is provided on this After Visit Summary.  MyChart is used to connect with patients for Virtual Visits (Telemedicine).  Patients are able to view lab/test results, encounter notes, upcoming appointments, etc.  Non-urgent messages can be sent to your provider as well.   To learn more about what you can do with MyChart, go to  [email or web address].    Your next appointment:   4-5 week(s)  The format for your next appointment:   In Person  Provider:   You will see one of the following Advanced Practice Providers on your designated Care Team:   Bernerd Pho, PA-C  Ermalinda Barrios, PA-C    Other Instructions Thank you for choosing [facility]!

## 2021-02-01 ENCOUNTER — Telehealth: Payer: Self-pay

## 2021-02-01 NOTE — Telephone Encounter (Signed)
Called to arrange Watchman consult per [place]. The patient states she thought about it since her visit yesterday and does not want to proceed or schedule a consult. She understands to call back if she changes her mind. She was grateful for call and agrees with plan.

## 2021-02-02 DIAGNOSIS — E7849 Other hyperlipidemia: Secondary | ICD-10-CM | POA: Diagnosis not present

## 2021-02-02 DIAGNOSIS — J9621 Acute and chronic respiratory failure with hypoxia: Secondary | ICD-10-CM | POA: Diagnosis not present

## 2021-02-02 DIAGNOSIS — I5032 Chronic diastolic (congestive) heart failure: Secondary | ICD-10-CM | POA: Diagnosis not present

## 2021-02-02 DIAGNOSIS — I110 Hypertensive heart disease with heart failure: Secondary | ICD-10-CM | POA: Diagnosis not present

## 2021-02-13 DIAGNOSIS — R601 Generalized edema: Secondary | ICD-10-CM | POA: Diagnosis not present

## 2021-02-13 DIAGNOSIS — D509 Iron deficiency anemia, unspecified: Secondary | ICD-10-CM | POA: Diagnosis not present

## 2021-02-13 DIAGNOSIS — D500 Iron deficiency anemia secondary to blood loss (chronic): Secondary | ICD-10-CM | POA: Diagnosis not present

## 2021-02-13 DIAGNOSIS — E611 Iron deficiency: Secondary | ICD-10-CM | POA: Diagnosis not present

## 2021-02-13 DIAGNOSIS — F17210 Nicotine dependence, cigarettes, uncomplicated: Secondary | ICD-10-CM | POA: Diagnosis not present

## 2021-02-15 ENCOUNTER — Other Ambulatory Visit: Payer: Self-pay

## 2021-02-15 ENCOUNTER — Ambulatory Visit (HOSPITAL_COMMUNITY)
Admission: RE | Admit: 2021-02-15 | Discharge: 2021-02-15 | Disposition: A | Discharge status: Home / Self Care | Payer: Medicare Other | Source: Ambulatory Visit | Attending: Student | Admitting: Student

## 2021-02-15 DIAGNOSIS — R600 Localized edema: Secondary | ICD-10-CM

## 2021-02-15 LAB — ECHOCARDIOGRAM COMPLETE
AR max vel: 1.86 cm2
AV Area VTI: 1.77 cm2
AV Area mean vel: 1.9 cm2
AV Mean grad: 8.5 mmHg
AV Peak grad: 17.1 mmHg
Ao pk vel: 2.07 m/s
Area-P 1/2: 3.21 cm2
S' Lateral: 1.8 cm

## 2021-02-15 NOTE — Progress Notes (Signed)
*  PRELIMINARY RESULTS* Echocardiogram 2D Echocardiogram has been performed.  Samuel Germany 02/15/2021, 10:46 AM

## 2021-02-17 ENCOUNTER — Telehealth: Payer: Self-pay

## 2021-03-06 NOTE — Telephone Encounter (Signed)
-----   Message from Erma Heritage, [place] sent at 02/15/2021  5:41 PM EDT ----- Please let the patient know her echocardiogram showed normal pumping function of the heart with a preserved EF of 60 to 65% and no wall motion abnormalities. She did have mild thickness of the heart muscle which is common to see with age and high blood pressure. Good blood pressure control is essential. She does have enlargement of the upper chambers of her heart which is secondary to atrial fibrillation and also noted to have elevated pulmonary pressures. Was noted to have mild leakage along the mitral valve and mild leakage along the aortic valve but no significant abnormalities. This is something we will continue to follow.  Please forward a copy of results to Angiulli, Day G, MD.

## 2021-03-06 NOTE — Telephone Encounter (Signed)
Spoke to pt's husband (per dpr)  who voiced understanding. Will forward to pt's pcp

## 2021-03-06 DEATH — deceased

## 2021-03-08 ENCOUNTER — Ambulatory Visit: Payer: Medicare Other | Admitting: Student
